# Patient Record
Sex: Male | Born: 1975 | Race: Black or African American | Hispanic: No | Marital: Married | Smoking: Former smoker
Health system: Southern US, Community
[De-identification: ages and names within clinical notes are randomized; demographics above are authoritative.]

## PROBLEM LIST (undated history)

## (undated) DIAGNOSIS — J45909 Unspecified asthma, uncomplicated: Secondary | ICD-10-CM

## (undated) DIAGNOSIS — I10 Essential (primary) hypertension: Secondary | ICD-10-CM

## (undated) DIAGNOSIS — R7303 Prediabetes: Secondary | ICD-10-CM

## (undated) DIAGNOSIS — M1990 Unspecified osteoarthritis, unspecified site: Secondary | ICD-10-CM

## (undated) HISTORY — PX: OTHER SURGICAL HISTORY: SHX169

## (undated) HISTORY — PX: HIP SURGERY: SHX245

---

## 1998-04-27 ENCOUNTER — Emergency Department (HOSPITAL_COMMUNITY): Admission: EM | Admit: 1998-04-27 | Discharge: 1998-04-27 | Payer: Self-pay | Admitting: Internal Medicine

## 1999-08-20 ENCOUNTER — Emergency Department (HOSPITAL_COMMUNITY): Admission: EM | Admit: 1999-08-20 | Discharge: 1999-08-20 | Payer: Self-pay | Admitting: Emergency Medicine

## 1999-08-20 ENCOUNTER — Encounter: Payer: Self-pay | Admitting: Emergency Medicine

## 1999-10-02 ENCOUNTER — Emergency Department (HOSPITAL_COMMUNITY): Admission: EM | Admit: 1999-10-02 | Discharge: 1999-10-02 | Payer: Self-pay | Admitting: Emergency Medicine

## 2001-06-23 ENCOUNTER — Emergency Department (HOSPITAL_COMMUNITY): Admission: EM | Admit: 2001-06-23 | Discharge: 2001-06-23 | Payer: Self-pay

## 2001-06-29 ENCOUNTER — Emergency Department (HOSPITAL_COMMUNITY): Admission: EM | Admit: 2001-06-29 | Discharge: 2001-06-29 | Payer: Self-pay | Admitting: Emergency Medicine

## 2001-06-29 ENCOUNTER — Encounter: Payer: Self-pay | Admitting: Emergency Medicine

## 2002-10-13 ENCOUNTER — Emergency Department (HOSPITAL_COMMUNITY): Admission: EM | Admit: 2002-10-13 | Discharge: 2002-10-13 | Payer: Self-pay | Admitting: Emergency Medicine

## 2002-10-13 ENCOUNTER — Encounter: Payer: Self-pay | Admitting: Emergency Medicine

## 2003-05-12 ENCOUNTER — Emergency Department (HOSPITAL_COMMUNITY): Admission: EM | Admit: 2003-05-12 | Discharge: 2003-05-12 | Payer: Self-pay | Admitting: Emergency Medicine

## 2003-08-23 ENCOUNTER — Emergency Department (HOSPITAL_COMMUNITY): Admission: AD | Admit: 2003-08-23 | Discharge: 2003-08-23 | Payer: Self-pay | Admitting: Family Medicine

## 2003-08-25 ENCOUNTER — Emergency Department (HOSPITAL_COMMUNITY): Admission: AD | Admit: 2003-08-25 | Discharge: 2003-08-25 | Payer: Self-pay | Admitting: Family Medicine

## 2003-09-01 ENCOUNTER — Emergency Department (HOSPITAL_COMMUNITY): Admission: AD | Admit: 2003-09-01 | Discharge: 2003-09-01 | Payer: Self-pay | Admitting: Family Medicine

## 2003-09-04 ENCOUNTER — Emergency Department (HOSPITAL_COMMUNITY): Admission: AD | Admit: 2003-09-04 | Discharge: 2003-09-04 | Payer: Self-pay | Admitting: Family Medicine

## 2003-12-28 ENCOUNTER — Emergency Department (HOSPITAL_COMMUNITY): Admission: EM | Admit: 2003-12-28 | Discharge: 2003-12-28 | Payer: Self-pay | Admitting: Family Medicine

## 2005-04-09 ENCOUNTER — Emergency Department (HOSPITAL_COMMUNITY): Admission: EM | Admit: 2005-04-09 | Discharge: 2005-04-09 | Payer: Self-pay | Admitting: Family Medicine

## 2005-11-18 ENCOUNTER — Emergency Department (HOSPITAL_COMMUNITY): Admission: EM | Admit: 2005-11-18 | Discharge: 2005-11-18 | Payer: Self-pay | Admitting: Family Medicine

## 2006-03-05 ENCOUNTER — Emergency Department (HOSPITAL_COMMUNITY): Admission: EM | Admit: 2006-03-05 | Discharge: 2006-03-05 | Payer: Self-pay | Admitting: Family Medicine

## 2006-11-11 ENCOUNTER — Emergency Department (HOSPITAL_COMMUNITY): Admission: EM | Admit: 2006-11-11 | Discharge: 2006-11-11 | Payer: Self-pay | Admitting: Family Medicine

## 2007-06-21 ENCOUNTER — Emergency Department (HOSPITAL_COMMUNITY): Admission: EM | Admit: 2007-06-21 | Discharge: 2007-06-21 | Payer: Self-pay | Admitting: Family Medicine

## 2007-11-09 ENCOUNTER — Emergency Department (HOSPITAL_COMMUNITY): Admission: EM | Admit: 2007-11-09 | Discharge: 2007-11-09 | Payer: Self-pay | Admitting: Emergency Medicine

## 2008-01-04 ENCOUNTER — Emergency Department (HOSPITAL_COMMUNITY): Admission: EM | Admit: 2008-01-04 | Discharge: 2008-01-04 | Payer: Self-pay | Admitting: Emergency Medicine

## 2008-11-17 ENCOUNTER — Emergency Department (HOSPITAL_COMMUNITY): Admission: EM | Admit: 2008-11-17 | Discharge: 2008-11-17 | Payer: Self-pay | Admitting: Emergency Medicine

## 2008-12-07 ENCOUNTER — Emergency Department (HOSPITAL_COMMUNITY): Admission: EM | Admit: 2008-12-07 | Discharge: 2008-12-07 | Payer: Self-pay | Admitting: Emergency Medicine

## 2009-10-11 IMAGING — CR DG ABDOMEN ACUTE W/ 1V CHEST
3 series · 3 of 3 positions shown · non-contrast
Comparison: Chest x-ray 11/17/2008

CLINICAL DATA: Abdominal pain.

ACUTE ABDOMEN SERIES (ABDOMEN 2 VIEW & CHEST 1 VIEW)

[w chest pa]
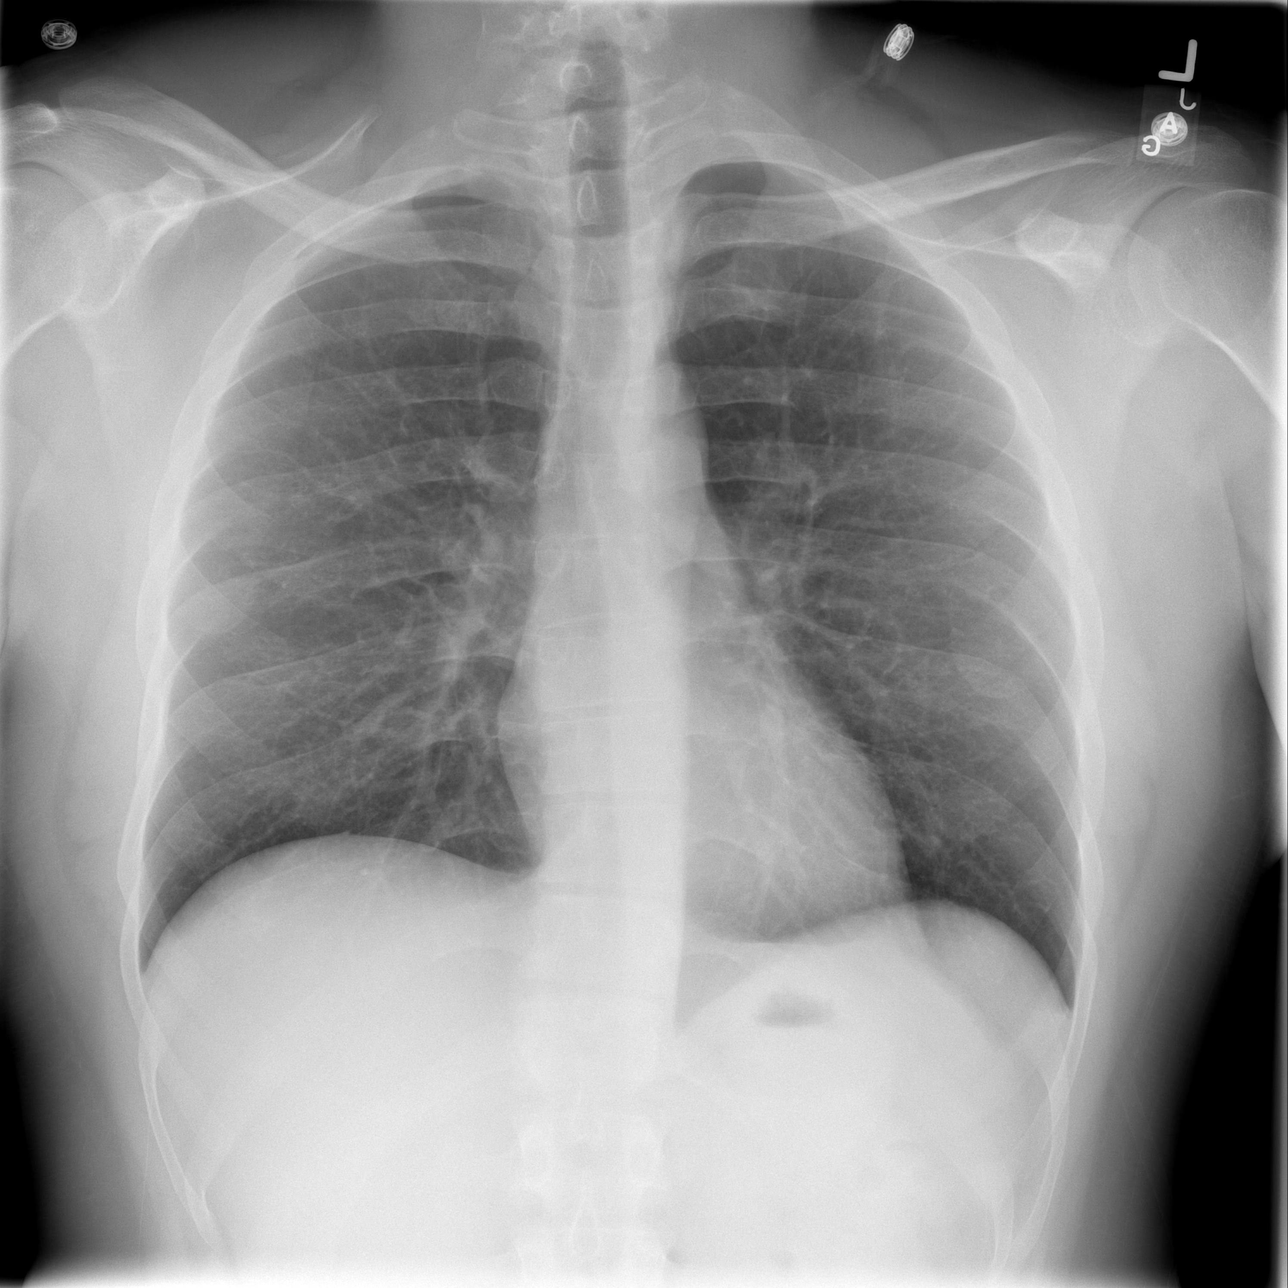

[w abdomen upright]
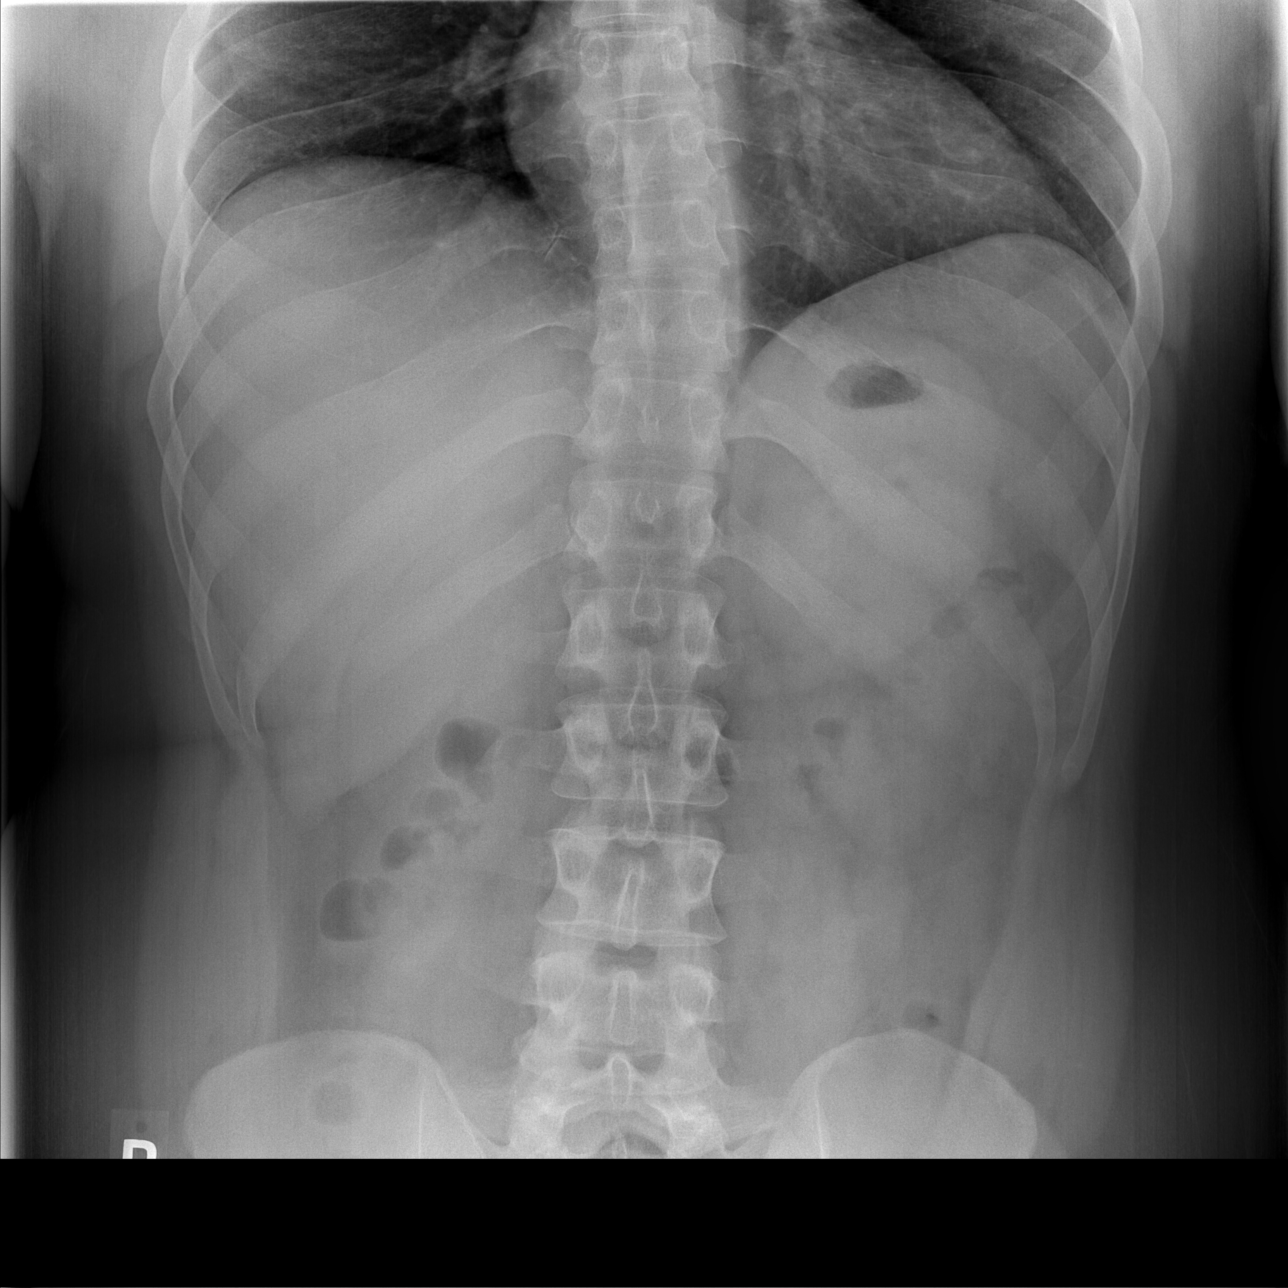

[t abdomen supine]
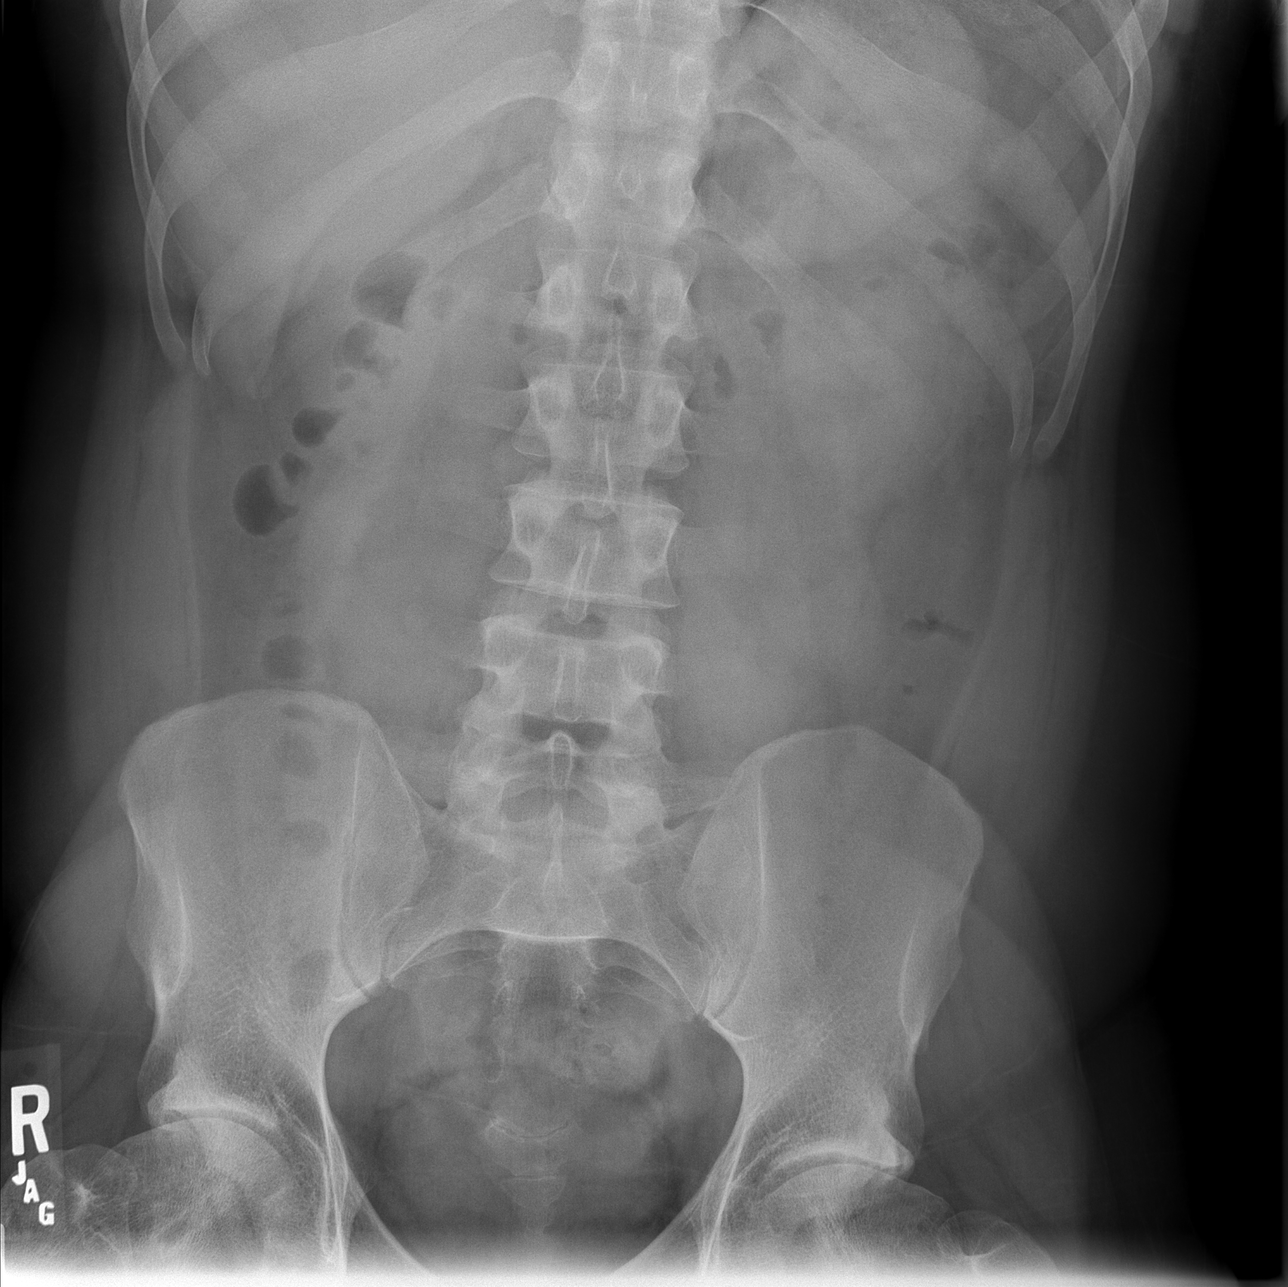

[3 of 3 positions shown; findings below may reference images not displayed]

FINDINGS: The bowel gas pattern is normal.  There is no evidence of
free intraperitoneal air.  No suspicious radio-opaque calculi or
other significant radiographic abnormality is seen. Heart size and
mediastinal contours are within normal limits.  Both lungs are
clear.
IMPRESSION: Unremarkable study.

## 2010-06-08 ENCOUNTER — Emergency Department (HOSPITAL_COMMUNITY): Admission: EM | Admit: 2010-06-08 | Discharge: 2010-06-09 | Payer: Self-pay | Admitting: Emergency Medicine

## 2010-12-22 LAB — COMPREHENSIVE METABOLIC PANEL
AST: 29 U/L (ref 0–37)
BUN: 6 mg/dL (ref 6–23)
CO2: 23 mEq/L (ref 19–32)
Calcium: 9.4 mg/dL (ref 8.4–10.5)
Creatinine, Ser: 0.97 mg/dL (ref 0.4–1.5)
GFR calc Af Amer: >60 mL/min (ref >60)
GFR calc non Af Amer: >60 mL/min (ref >60)

## 2010-12-22 LAB — DIFFERENTIAL
Basophils Absolute: 0 10*3/uL (ref 0.0–0.1)
Eosinophils Relative: 4 % (ref 0–5)
Lymphocytes Relative: 50 % — ABNORMAL HIGH (ref 12–46)
Lymphs Abs: 3.2 10*3/uL (ref 0.7–4.0)
Neutro Abs: 2.5 10*3/uL (ref 1.7–7.7)

## 2010-12-22 LAB — CBC
HCT: 37 % — ABNORMAL LOW (ref 39.0–52.0)
MCHC: 32.8 g/dL (ref 30.0–36.0)
MCV: 77.2 fL — ABNORMAL LOW (ref 78.0–100.0)
Platelets: 223 10*3/uL (ref 150–400)

## 2010-12-22 LAB — URINALYSIS, ROUTINE W REFLEX MICROSCOPIC
Bilirubin Urine: NEGATIVE
Glucose, UA: NEGATIVE mg/dL
Hgb urine dipstick: NEGATIVE
Protein, ur: NEGATIVE mg/dL
Urobilinogen, UA: 0.2 mg/dL (ref 0.0–1.0)

## 2010-12-22 LAB — LIPASE, BLOOD: Lipase: 15 U/L (ref 11–59)

## 2011-05-29 ENCOUNTER — Emergency Department (HOSPITAL_COMMUNITY)
Admission: EM | Admit: 2011-05-29 | Discharge: 2011-05-30 | Disposition: A | Discharge status: Home / Self Care | Payer: Self-pay | Attending: Emergency Medicine | Admitting: Emergency Medicine

## 2011-05-29 ENCOUNTER — Emergency Department (HOSPITAL_COMMUNITY)
Admission: EM | Admit: 2011-05-29 | Discharge: 2011-05-29 | Discharge status: Against Medical Advice | Payer: Self-pay | Attending: Emergency Medicine | Admitting: Emergency Medicine

## 2011-05-29 DIAGNOSIS — G8929 Other chronic pain: Secondary | ICD-10-CM | POA: Insufficient documentation

## 2011-05-29 DIAGNOSIS — M545 Low back pain, unspecified: Secondary | ICD-10-CM | POA: Insufficient documentation

## 2011-05-29 DIAGNOSIS — M25559 Pain in unspecified hip: Secondary | ICD-10-CM | POA: Insufficient documentation

## 2011-05-30 ENCOUNTER — Emergency Department (HOSPITAL_COMMUNITY): Payer: Self-pay

## 2012-03-23 ENCOUNTER — Emergency Department (INDEPENDENT_AMBULATORY_CARE_PROVIDER_SITE_OTHER)
Admission: EM | Admit: 2012-03-23 | Discharge: 2012-03-23 | Disposition: A | Discharge status: Home / Self Care | Payer: Self-pay | Source: Home / Self Care | Attending: Emergency Medicine | Admitting: Emergency Medicine

## 2012-03-23 ENCOUNTER — Encounter (HOSPITAL_COMMUNITY): Payer: Self-pay | Admitting: *Deleted

## 2012-03-23 DIAGNOSIS — K047 Periapical abscess without sinus: Secondary | ICD-10-CM

## 2012-03-23 MED ORDER — CLINDAMYCIN HCL 150 MG PO CAPS: 150.0000 mg | ORAL_CAPSULE | Freq: Three times a day (TID) | ORAL | Status: AC

## 2012-03-23 NOTE — ED Provider Notes (Signed)
Medical screening examination/treatment/procedure(s) were performed by non-physician practitioner and as supervising physician I was immediately available for consultation/collaboration.  [place]   Anadelia Kintz, MD 03/23/12 2022 

## 2012-03-23 NOTE — ED Notes (Signed)
Co toothache for past few weeks, has tried orajel with no relief.

## 2012-03-23 NOTE — ED Provider Notes (Signed)
History     CSN: [id number]  Arrival date & time 03/23/12  1205   First MD Initiated Contact with Patient 03/23/12 1234      Chief Complaint  Patient presents with  . Dental Pain    (Consider location/radiation/quality/duration/timing/severity/associated sxs/prior treatment) HPI Comments: Pt states that he has a history of dental problems, but not with the same tooth:pt states that he had 2 clindamycin left over from the last time and the swelling has actually gone down a little:pt denies fever  Patient is a 36 y.o. male presenting with tooth pain. The history is provided by the patient. No language interpreter was used.  Dental PainThe primary symptoms include mouth pain. The symptoms are unchanged. The symptoms occur constantly.  Additional symptoms include: facial swelling.    No past medical history on file.  No past surgical history on file.  No family history on file.  History  Substance Use Topics  . Smoking status: Not on file  . Smokeless tobacco: Not on file  . Alcohol Use: Not on file      Review of Systems  Constitutional: Negative.   HENT: Positive for facial swelling.   Respiratory: Negative.   Cardiovascular: Negative.     Allergies  Review of patient's allergies indicates not on file.  Home Medications  No current outpatient prescriptions on file.  BP 134/83  Pulse 66  Temp 98 F (36.7 C) (Oral)  Resp 18  SpO2 100%  Physical Exam  Nursing note and vitals reviewed. Constitutional: He appears well-developed and well-nourished.  HENT:  Right Ear: External ear normal.  Left Ear: External ear normal.       Mild swelling noted to the left upper cheek:pt has multiple decayed and cracked teeth to the left upper dentition  Cardiovascular: Normal rate and regular rhythm.   Pulmonary/Chest: Effort normal and breath sounds normal.    ED Course  Procedures (including critical care time)  Labs Reviewed - No data to display No results  found.   1. Dental abscess       MDM  Pt allergic to NWG:NFAO treat with clinda:discussed follow up with the dentist        Teressa Lower, NP 03/23/12 1402

## 2012-10-03 ENCOUNTER — Emergency Department (INDEPENDENT_AMBULATORY_CARE_PROVIDER_SITE_OTHER): Payer: Self-pay

## 2012-10-03 ENCOUNTER — Encounter (HOSPITAL_COMMUNITY): Payer: Self-pay | Admitting: *Deleted

## 2012-10-03 ENCOUNTER — Emergency Department (INDEPENDENT_AMBULATORY_CARE_PROVIDER_SITE_OTHER)
Admission: EM | Admit: 2012-10-03 | Discharge: 2012-10-03 | Disposition: A | Discharge status: Home / Self Care | Payer: Self-pay | Source: Home / Self Care | Attending: Emergency Medicine | Admitting: Emergency Medicine

## 2012-10-03 DIAGNOSIS — J189 Pneumonia, unspecified organism: Secondary | ICD-10-CM

## 2012-10-03 DIAGNOSIS — J45909 Unspecified asthma, uncomplicated: Secondary | ICD-10-CM

## 2012-10-03 HISTORY — DX: Unspecified asthma, uncomplicated: J45.909

## 2012-10-03 MED ORDER — AZITHROMYCIN 250 MG PO TABS: ORAL_TABLET | ORAL | Status: DC

## 2012-10-03 MED ORDER — ALBUTEROL SULFATE HFA 108 (90 BASE) MCG/ACT IN AERS: 1.0000 | INHALATION_SPRAY | Freq: Four times a day (QID) | RESPIRATORY_TRACT | Status: DC | PRN

## 2012-10-03 NOTE — ED Notes (Signed)
Pt reports cough and congestion since 1/17 with red streaks in sputum - son sick with similar symptoms - body  Aches, fever, headache, cough/congestion

## 2012-10-03 NOTE — ED Provider Notes (Addendum)
Chief Complaint  Patient presents with  . URI    History of Present Illness:   Justin Walls  is a 37 year old male with a history of asthma who has had a one-week history of cough productive of yellow to brown sputum with blood, aching in his chest when he coughs, trouble breathing or chest tightness and wheezing, chills, and he had some fever at the onset of symptoms. He's had nasal congestion, rhinorrhea with bloody drainage, headache, sinus pressure, ear popping, and sore throat. He's felt lightheaded, nauseated, not had much of an appetite. He has a 4 to five-year history of asthma which is mild and intermittent. He is not a smoker. He controls this with albuterol inhaler which she uses as needed, he states he's not had to use one in years.  Review of Systems:  Other than noted above, the patient denies any of the following symptoms. Systemic:  No fever, chills, sweats, fatigue, myalgias, headache, or anorexia. Eye:  No redness, pain or drainage. ENT:  No earache, ear congestion, nasal congestion, sneezing, rhinorrhea, sinus pressure, sinus pain, post nasal drip, or sore throat. Lungs:  No cough, sputum production, wheezing, shortness of breath, or chest pain. GI:  No abdominal pain, nausea, vomiting, or diarrhea.  PMFSH:  Past medical history, family history, social history, meds, and allergies were reviewed.  Physical Exam:   Vital signs:  BP 162/93  Pulse 68  Temp 98.7 F (37.1 C) (Oral)  Resp 18  SpO2 100% General:  Alert, in no distress. Eye:  No conjunctival injection or drainage. Lids were normal. ENT:  TMs and canals were normal, without erythema or inflammation.  Nasal mucosa was clear and uncongested, without drainage.  Mucous membranes were moist.  Pharynx was clear, without exudate or drainage.  There were no oral ulcerations or lesions. Neck:  Supple, no adenopathy, tenderness or mass. Lungs:  No respiratory distress.  Lungs were clear to auscultation, without wheezes,  rales or rhonchi.  Breath sounds were clear and equal bilaterally.  Heart:  Regular rhythm, without gallops, murmers or rubs. Skin:  Clear, warm, and dry, without rash or lesions.  Radiology:  Dg Chest 2 View  10/03/2012  *RADIOLOGY REPORT*  Clinical Data: Productive cough and hemoptysis  CHEST - 2 VIEW  Comparison: November 17, 2008  Findings:  Patchy infiltrate is noted in the anterior segment right upper lobe.  Lungs are otherwise clear.  Heart size and pulmonary vascularity are normal.  No adenopathy.  There is mild lower thoracic levoscoliosis.  IMPRESSION: Patchy infiltrate anterior segment right upper lobe. Would advise followup films to clearing given the hemoptysis. Elsewhere, lungs clear.   Original Report Authenticated By: Bretta Bang, M.D.    I reviewed the images independently and personally and concur with the radiologist's findings.  Assessment:  The primary encounter diagnosis was Community acquired pneumonia. A diagnosis of Asthma was also pertinent to this visit.  Plan:   1.  The following meds were prescribed:   New Prescriptions   ALBUTEROL (PROVENTIL HFA;VENTOLIN HFA) 108 (90 BASE) MCG/ACT INHALER    Inhale 1-2 puffs into the lungs every 6 (six) hours as needed for wheezing.   AZITHROMYCIN (ZITHROMAX Z-PAK) 250 MG TABLET    Take as directed.   2.  The patient was instructed in symptomatic care and handouts were given. 3.  The patient was told to return if becoming worse in any way, for a routine recheck in 48 hours, and given some red flag symptoms that would  indicate earlier return. I also suggested he return again in a month for a repeat chest x-ray.The patient was told that the differential diagnosis includes cancer, and that it was of the utmost importance to followup with the specialist to whom he was referred. I also discussed his blood pressure when and told he would need followup for that as well.    Reuben Likes, MD 10/03/12 1600  Reuben Likes, MD 10/03/12  [phone]

## 2012-10-05 ENCOUNTER — Encounter (HOSPITAL_COMMUNITY): Payer: Self-pay | Admitting: Emergency Medicine

## 2012-10-05 ENCOUNTER — Emergency Department (INDEPENDENT_AMBULATORY_CARE_PROVIDER_SITE_OTHER)
Admission: EM | Admit: 2012-10-05 | Discharge: 2012-10-05 | Disposition: A | Discharge status: Home / Self Care | Payer: Self-pay | Source: Home / Self Care | Attending: Family Medicine | Admitting: Family Medicine

## 2012-10-05 ENCOUNTER — Emergency Department (INDEPENDENT_AMBULATORY_CARE_PROVIDER_SITE_OTHER): Payer: Self-pay

## 2012-10-05 DIAGNOSIS — J189 Pneumonia, unspecified organism: Secondary | ICD-10-CM

## 2012-10-05 MED ORDER — HYDROCODONE-HOMATROPINE 5-1.5 MG/5ML PO SYRP: 5.0000 mL | ORAL_SOLUTION | ORAL | Status: DC | PRN

## 2012-10-05 NOTE — ED Provider Notes (Signed)
History     CSN: [id number]  Arrival date & time 10/05/12  1155   First MD Initiated Contact with Patient 10/05/12 1215      Chief Complaint  Patient presents with  . Follow-up    pt here for follow up of pneumonia.     (Consider location/radiation/quality/duration/timing/severity/associated sxs/prior treatment) Patient is a 37 y.o. male presenting with cough. The history is provided by the patient. No language interpreter was used.  Cough This is a new problem. The problem occurs constantly. The problem has been gradually worsening. The cough is non-productive. There has been no fever. Associated symptoms include myalgias and shortness of breath. The treatment provided no relief. He is not a smoker. His past medical history is significant for pneumonia.  Pt complains of worsening bodyaches.  Pt reports chest is hurting.  Pt is here for recheck of pneumonia/mass/htn  Past Medical History  Diagnosis Date  . Asthma     Past Surgical History  Procedure Date  . Hip sx   . Hip surgery     History reviewed. No pertinent family history.  History  Substance Use Topics  . Smoking status: Never Smoker   . Smokeless tobacco: Not on file  . Alcohol Use: Yes      Review of Systems  Respiratory: Positive for cough and shortness of breath.   Musculoskeletal: Positive for myalgias.  All other systems reviewed and are negative.    Allergies  Amoxicillin  Home Medications   Current Outpatient Rx  Name  Route  Sig  Dispense  Refill  . ALBUTEROL SULFATE HFA 108 (90 BASE) MCG/ACT IN AERS   Inhalation   Inhale 1-2 puffs into the lungs every 6 (six) hours as needed for wheezing.   1 Inhaler   12   . AZITHROMYCIN 250 MG PO TABS      Take as directed.   6 tablet   0     BP 132/95  Pulse 72  Temp 98.1 F (36.7 C) (Oral)  Resp 18  SpO2 100%  Physical Exam  Nursing note and vitals reviewed. Constitutional: He is oriented to person, place, and time. He appears  well-developed and well-nourished.  HENT:  Head: Normocephalic and atraumatic.  Eyes: Pupils are equal, round, and reactive to light.  Neck: Normal range of motion. Neck supple.  Cardiovascular: Normal rate.   Pulmonary/Chest: Effort normal.  Abdominal: Soft.  Musculoskeletal: Normal range of motion.  Neurological: He is alert and oriented to person, place, and time.  Skin: Skin is warm.  Psychiatric: He has a normal mood and affect.    ED Course  Procedures (including critical care time)  Labs Reviewed - No data to display No results found.   No diagnosis found.    MDM  I rexrayed pt to make sure no expanding pneumonia.   Xray no change.  Pt advised must have 5 week repeat xray.   Pt advised followup as instructed by Dr. Lorenz Coaster.   Pt given hycodan to help with pain and cough       Lonia Skinner [place], [place] 10/05/12 1431

## 2012-10-05 NOTE — ED Notes (Signed)
Pt here for follow up of pneumonia. Pt was seen on 10/04/11. Pt states that he feels worse. Body hurts all over. And having pains in chest. Pt just started meds on 1/24, due to financial reasons.   Pt denies any other symptoms.

## 2012-10-07 NOTE — ED Provider Notes (Signed)
Medical screening examination/treatment/procedure(s) were performed by resident physician or non-physician practitioner and as supervising physician I was immediately available for consultation/collaboration.   KINDL,JAMES DOUGLAS MD.    James D Kindl, MD 10/07/12 1558 

## 2014-10-29 ENCOUNTER — Emergency Department (INDEPENDENT_AMBULATORY_CARE_PROVIDER_SITE_OTHER)
Admission: EM | Admit: 2014-10-29 | Discharge: 2014-10-29 | Disposition: A | Discharge status: Home / Self Care | Payer: PRIVATE HEALTH INSURANCE | Source: Home / Self Care | Attending: Family Medicine | Admitting: Family Medicine

## 2014-10-29 ENCOUNTER — Encounter (HOSPITAL_COMMUNITY): Payer: Self-pay | Admitting: Emergency Medicine

## 2014-10-29 ENCOUNTER — Ambulatory Visit: Payer: Self-pay | Attending: Internal Medicine

## 2014-10-29 DIAGNOSIS — K029 Dental caries, unspecified: Secondary | ICD-10-CM

## 2014-10-29 MED ORDER — LIDOCAINE VISCOUS 2 % MT SOLN: OROMUCOSAL | Status: DC

## 2014-10-29 MED ORDER — NAPROXEN 500 MG PO TABS: 500.0000 mg | ORAL_TABLET | Freq: Two times a day (BID) | ORAL | Status: DC

## 2014-10-29 NOTE — ED Provider Notes (Signed)
CSN: [id number]     Arrival date & time 10/29/14  1045 History   First MD Initiated Contact with Patient 10/29/14 1143     Chief Complaint  Patient presents with  . Dental Pain   (Consider location/radiation/quality/duration/timing/severity/associated sxs/prior Treatment) Patient is a 39 y.o. male presenting with tooth pain. The history is provided by the patient.  Dental Pain Location:  Lower Lower teeth location:  18/LL 2nd molar Quality:  Aching and constant Severity:  Moderate Onset quality:  Gradual Duration:  3 weeks Timing:  Constant Progression:  Worsening Chronicity:  Recurrent Context: dental caries   Ineffective treatments:  NSAIDs Risk factors: lack of dental care and smoking     Past Medical History  Diagnosis Date  . Asthma    Past Surgical History  Procedure Laterality Date  . Hip sx    . Hip surgery     History reviewed. No pertinent family history. History  Substance Use Topics  . Smoking status: Never Smoker   . Smokeless tobacco: Not on file  . Alcohol Use: Yes    Review of Systems  All other systems reviewed and are negative.   Allergies  Amoxicillin  Home Medications   Prior to Admission medications   Medication Sig Start Date End Date Taking? Authorizing Provider  albuterol (PROVENTIL HFA;VENTOLIN HFA) 108 (90 BASE) MCG/ACT inhaler Inhale 1-2 puffs into the lungs every 6 (six) hours as needed for wheezing. 10/03/12   Reuben Likes, MD  azithromycin (ZITHROMAX Z-PAK) 250 MG tablet Take as directed. 10/03/12   Reuben Likes, MD  HYDROcodone-homatropine [facility]) 5-1.5 MG/5ML syrup Take 5 mLs by mouth every 4 (four) hours as needed for cough. 10/05/12   Elson Areas, PA-C  lidocaine (XYLOCAINE) 2 % solution Apply to affected area using cotton swab every 3 hours as needed for pain 10/29/14   Ria Clock, PA  naproxen (NAPROSYN) 500 MG tablet Take 1 tablet (500 mg total) by mouth 2 (two) times daily with a meal. As needed for pain  10/29/14   Jess Barters H Gizell Danser, PA   BP 138/79 mmHg  Pulse 74  Temp(Src) 98.6 F (37 C) (Oral)  Resp 18  SpO2 98% Physical Exam  Constitutional: He is oriented to person, place, and time. He appears well-developed and well-nourished. No distress.  HENT:  Head: Normocephalic and atraumatic.  Right Ear: Hearing and external ear normal. No mastoid tenderness.  Left Ear: Hearing and external ear normal. No mastoid tenderness.  Nose: Nose normal.  Mouth/Throat: Uvula is midline, oropharynx is clear and moist and mucous membranes are normal. No oral lesions. No trismus in the jaw. Dental caries present. No dental abscesses or uvula swelling.    Neck: Normal range of motion. Neck supple.  Cardiovascular: Normal rate.   Pulmonary/Chest: Effort normal.  Lymphadenopathy:    He has no cervical adenopathy.  Neurological: He is alert and oriented to person, place, and time.  Skin: Skin is warm and dry.  Psychiatric: He has a normal mood and affect. His behavior is normal.  Nursing note and vitals reviewed.   ED Course  Procedures (including critical care time) Labs Review Labs Reviewed - No data to display  Imaging Review No results found.   MDM   1. Dental cavity   Naprosyn and viscous lidocaine as directed. Patient provided with printed list of low cost dental option in area as he has had some difficulty accessing the dental clinic associated with [facility] orange card discount. No  clinical evidence of dental abscess.    Ria ClockJennifer Lee H Mykira Hofmeister, [place] 10/29/14 [phone]

## 2014-10-29 NOTE — ED Notes (Signed)
Pt states that he has had dental pain from what he believes to be a tooth abcess for 3 weeks.

## 2014-10-29 NOTE — Discharge Instructions (Signed)

## 2015-01-31 ENCOUNTER — Emergency Department (HOSPITAL_COMMUNITY)
Admission: EM | Admit: 2015-01-31 | Discharge: 2015-01-31 | Disposition: A | Discharge status: Home / Self Care | Payer: PRIVATE HEALTH INSURANCE | Attending: Emergency Medicine | Admitting: Emergency Medicine

## 2015-01-31 ENCOUNTER — Encounter (HOSPITAL_COMMUNITY): Payer: Self-pay | Admitting: *Deleted

## 2015-01-31 ENCOUNTER — Emergency Department (HOSPITAL_COMMUNITY): Payer: PRIVATE HEALTH INSURANCE

## 2015-01-31 DIAGNOSIS — G8929 Other chronic pain: Secondary | ICD-10-CM | POA: Insufficient documentation

## 2015-01-31 DIAGNOSIS — M25559 Pain in unspecified hip: Secondary | ICD-10-CM

## 2015-01-31 DIAGNOSIS — Z880 Allergy status to penicillin: Secondary | ICD-10-CM | POA: Insufficient documentation

## 2015-01-31 DIAGNOSIS — Z79899 Other long term (current) drug therapy: Secondary | ICD-10-CM | POA: Insufficient documentation

## 2015-01-31 DIAGNOSIS — Z791 Long term (current) use of non-steroidal anti-inflammatories (NSAID): Secondary | ICD-10-CM | POA: Insufficient documentation

## 2015-01-31 DIAGNOSIS — J45909 Unspecified asthma, uncomplicated: Secondary | ICD-10-CM | POA: Insufficient documentation

## 2015-01-31 DIAGNOSIS — I10 Essential (primary) hypertension: Secondary | ICD-10-CM | POA: Insufficient documentation

## 2015-01-31 DIAGNOSIS — M25552 Pain in left hip: Secondary | ICD-10-CM | POA: Insufficient documentation

## 2015-01-31 DIAGNOSIS — Z9889 Other specified postprocedural states: Secondary | ICD-10-CM | POA: Insufficient documentation

## 2015-01-31 DIAGNOSIS — M25551 Pain in right hip: Secondary | ICD-10-CM

## 2015-01-31 HISTORY — DX: Essential (primary) hypertension: I10

## 2015-01-31 MED ORDER — DOCUSATE SODIUM 100 MG PO CAPS: 100.0000 mg | ORAL_CAPSULE | Freq: Every day | ORAL | Status: DC | PRN

## 2015-01-31 MED ORDER — OXYCODONE-ACETAMINOPHEN 5-325 MG PO TABS: 1.0000 | ORAL_TABLET | Freq: Four times a day (QID) | ORAL | Status: DC | PRN

## 2015-01-31 MED ORDER — OXYCODONE-ACETAMINOPHEN 5-325 MG PO TABS
1.0000 | ORAL_TABLET | Freq: Once | ORAL | Status: AC
  Administered 2015-01-31: 1 via ORAL
  Filled 2015-01-31: qty 1

## 2015-01-31 MED ORDER — ONDANSETRON HCL 4 MG/2ML IJ SOLN
4.0000 mg | Freq: Once | INTRAMUSCULAR | Status: AC
  Administered 2015-01-31: 4 mg via INTRAVENOUS
  Filled 2015-01-31: qty 2

## 2015-01-31 MED ORDER — FENTANYL CITRATE (PF) 100 MCG/2ML IJ SOLN
50.0000 ug | Freq: Once | INTRAMUSCULAR | Status: AC
  Administered 2015-01-31: 50 ug via INTRAVENOUS
  Filled 2015-01-31: qty 2

## 2015-01-31 MED ORDER — HYDROMORPHONE HCL 1 MG/ML IJ SOLN
1.0000 mg | Freq: Once | INTRAMUSCULAR | Status: AC
  Administered 2015-01-31: 1 mg via INTRAVENOUS
  Filled 2015-01-31: qty 1

## 2015-01-31 MED ORDER — KETOROLAC TROMETHAMINE 30 MG/ML IJ SOLN
30.0000 mg | Freq: Once | INTRAMUSCULAR | Status: AC
  Administered 2015-01-31: 30 mg via INTRAVENOUS
  Filled 2015-01-31: qty 1

## 2015-01-31 NOTE — ED Provider Notes (Signed)
CSN: [id number]     Arrival date & time 01/31/15  1205 History   First MD Initiated Contact with Patient 01/31/15 1327     Chief Complaint  Patient presents with  . Hip Pain     (Consider location/radiation/quality/duration/timing/severity/associated sxs/prior Treatment) Patient is a 39 y.o. male presenting with hip pain. The history is provided by the patient.  Hip Pain This is a chronic problem. The current episode started more than 1 week ago. The problem occurs constantly. The problem has been gradually worsening. Pertinent negatives include no chest pain, no abdominal pain, no headaches and no shortness of breath. The symptoms are aggravated by walking. Nothing relieves the symptoms. Treatments tried: ibuprofen. The treatment provided mild relief.    Past Medical History  Diagnosis Date  . Asthma   . Hypertension    Past Surgical History  Procedure Laterality Date  . Hip sx    . Hip surgery     No family history on file. History  Substance Use Topics  . Smoking status: Never Smoker   . Smokeless tobacco: Not on file  . Alcohol Use: Yes    Review of Systems  Constitutional: Negative for fever.  HENT: Negative for drooling and rhinorrhea.   Eyes: Negative for pain.  Respiratory: Negative for cough and shortness of breath.   Cardiovascular: Negative for chest pain and leg swelling.  Gastrointestinal: Negative for nausea, vomiting, abdominal pain and diarrhea.  Genitourinary: Negative for dysuria and hematuria.  Musculoskeletal: Negative for gait problem and neck pain.  Skin: Negative for color change.  Neurological: Negative for numbness and headaches.  Hematological: Negative for adenopathy.  Psychiatric/Behavioral: Negative for behavioral problems.  All other systems reviewed and are negative.     Allergies  Amoxicillin  Home Medications   Prior to Admission medications   Medication Sig Start Date End Date Taking? Authorizing Provider  albuterol  (PROVENTIL HFA;VENTOLIN HFA) 108 (90 BASE) MCG/ACT inhaler Inhale 1-2 puffs into the lungs every 6 (six) hours as needed for wheezing. 10/03/12  Yes Reuben Likes, MD  azithromycin (ZITHROMAX Z-PAK) 250 MG tablet Take as directed. Patient not taking: Reported on 01/31/2015 10/03/12   Reuben Likes, MD  HYDROcodone-homatropine [facility]) 5-1.5 MG/5ML syrup Take 5 mLs by mouth every 4 (four) hours as needed for cough. Patient not taking: Reported on 01/31/2015 10/05/12   Elson Areas, PA-C  lidocaine (XYLOCAINE) 2 % solution Apply to affected area using cotton swab every 3 hours as needed for pain Patient not taking: Reported on 01/31/2015 10/29/14   Ria Clock, PA  naproxen (NAPROSYN) 500 MG tablet Take 1 tablet (500 mg total) by mouth 2 (two) times daily with a meal. As needed for pain Patient not taking: Reported on 01/31/2015 10/29/14   Jess Barters H Presson, PA   BP 157/71 mmHg  Pulse 68  Temp(Src) 97.7 F (36.5 C) (Oral)  Resp 18  SpO2 100% Physical Exam  Constitutional: He is oriented to person, place, and time. He appears well-developed and well-nourished.  HENT:  Head: Normocephalic and atraumatic.  Right Ear: External ear normal.  Left Ear: External ear normal.  Nose: Nose normal.  Mouth/Throat: Oropharynx is clear and moist. No oropharyngeal exudate.  Eyes: Conjunctivae and EOM are normal. Pupils are equal, round, and reactive to light.  Neck: Normal range of motion. Neck supple.  Cardiovascular: Normal rate, regular rhythm, normal heart sounds and intact distal pulses.  Exam reveals no gallop and no friction rub.   No  murmur heard. Pulmonary/Chest: Effort normal and breath sounds normal. No respiratory distress. He has no wheezes.  Abdominal: Soft. Bowel sounds are normal. He exhibits no distension. There is no tenderness. There is no rebound and no guarding.  Musculoskeletal: Normal range of motion. He exhibits tenderness. He exhibits no edema.  Normal range of motion  of the left hip and knee. Mild pain with ranging the left hip.  Significantly limited range of motion of the right hip due to pain.  2+ pulses and distal bilateral lower extremities.  Neurological: He is alert and oriented to person, place, and time.  Skin: Skin is warm and dry.  Psychiatric: He has a normal mood and affect. His behavior is normal.  Nursing note and vitals reviewed.   ED Course  Procedures (including critical care time) Labs Review Labs Reviewed - No data to display  Imaging Review Dg Hip Unilat With Pelvis 2-3 Views Right  01/31/2015   CLINICAL DATA:  Right hip pain today. No known injury. Chronic bilateral hip pain. Previous bilateral hip surgery  EXAM: RIGHT HIP (WITH PELVIS) 2-3 VIEWS  COMPARISON:  None.  FINDINGS: There is symmetrical deformity of the femoral heads. There is also marked superior left hip joint space narrowing and moderate superior right hip joint space narrowing. Mild bilateral inferior spur formation is also noted. There are screw tracts in both femoral necks.  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   Electronically Signed   By: Beckie Salts M.D.   On: 01/31/2015 13:28     EKG Interpretation None      MDM   Final diagnoses:  Hip pain    2:03 PM 39 y.o. 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 He notes acute worsening of the right hip pain over the last 2-3 days. He denies any injury. He denies any fevers. He is afebrile and vital signs are unremarkable here. We'll get pain control and screening imaging. The patient states that he takes ibuprofen intermittently for the pain.  4:23 PM: Pain improved. Imaging non-contrib. Will provide crutches and Rx for pain medicine. Pt will need pcp and ultimately  repeat ortho eval in the furture. I have discussed the diagnosis/risks/treatment options with the patient and believe the pt to be eligible for discharge home to follow-up with a pcp. We also discussed returning to the ED immediately if new or worsening sx occur. We discussed the sx which are most concerning (e.g., worsening pain, fever) that necessitate immediate return. Medications administered to the patient during their visit and any new prescriptions provided to the patient are listed below.  Medications given during this visit Medications  fentaNYL (SUBLIMAZE) injection 50 mcg (50 mcg Intravenous Given 01/31/15 1224)  ondansetron (ZOFRAN) injection 4 mg (4 mg Intravenous Given 01/31/15 1224)  HYDROmorphone (DILAUDID) injection 1 mg (1 mg Intravenous Given 01/31/15 1409)  ketorolac (TORADOL) 30 MG/ML injection 30 mg (30 mg Intravenous Given 01/31/15 1410)  oxyCODONE-acetaminophen (PERCOCET/ROXICET) 5-325 MG per tablet 1 tablet (1 tablet Oral Given 01/31/15 1554)    New Prescriptions   DOCUSATE SODIUM (COLACE) 100 MG CAPSULE    Take 1 capsule (100 mg total) by mouth daily as needed for mild constipation.   OXYCODONE-ACETAMINOPHEN (PERCOCET) 5-325 MG PER TABLET    Take 1-2 tablets by mouth every 6 (six) hours as needed  for moderate pain.     Purvis SheffieldForrest Hailyn Zarr, MD 02/01/15 1750

## 2015-01-31 NOTE — ED Notes (Addendum)
Pt reports Hx bil hip surgery placing pins in hips 2/2 multiple dislocations. No problems since then except arthritis pain. Today was about to get in the shower and lifted R leg to step in and felt immediate sharp pain down R leg, and had to lower self to the floor to avoid falling. No shortening or rotation noted.

## 2015-01-31 NOTE — Discharge Instructions (Signed)
°Emergency Department Resource Guide °1) Find a Doctor and Pay Out of Pocket °Although you won't have to find out who is covered by your insurance plan, it is a good idea to ask around and get recommendations. You will then need to call the office and see if the doctor you have chosen will accept you as a new patient and what types of options they offer for patients who are self-pay. Some doctors offer discounts or will set up payment plans for their patients who do not have insurance, but you will need to ask so you aren't surprised when you get to your appointment. ° °2) Contact Your Local Health Department °Not all health departments have doctors that can see patients for sick visits, but many do, so it is worth a call to see if yours does. If you don't know where your local health department is, you can check in your phone book. The CDC also has a tool to help you locate your state's health department, and many state websites also have listings of all of their local health departments. ° °3) Find a Walk-in Clinic °If your illness is not likely to be very severe or complicated, you may want to try a walk in clinic. These are popping up all over the country in pharmacies, drugstores, and shopping centers. They're usually staffed by nurse practitioners or physician assistants that have been trained to treat common illnesses and complaints. They're usually fairly quick and inexpensive. However, if you have serious medical issues or chronic medical problems, these are probably not your best option. ° °No Primary Care Doctor: °- Call [facility] at  832-8000 - they can help you locate a primary care doctor that  accepts your insurance, provides certain services, etc. °- Physician Referral Service- 1-800-533-3463 ° °Chronic Pain Problems: °Organization         Address  Phone   Notes  °[facility]  (336) 297-2271 Patients need to be referred by their primary care doctor.  ° °Medication  Assistance: °Organization         Address  Phone   Notes  °[place] [facility] [address] °[place], [place] [address] (336) 641-8030 --Must be a resident of [place] °-- Must have NO insurance coverage whatsoever (no Medicaid/ Medicare, etc.) °-- The pt. MUST have a primary care doctor that directs their care regularly and follows them in the community °  °[facility]  (866) 331-1348   °[employer]  (888) 892-1162   ° °Agencies that provide inexpensive medical care: °Organization         Address  Phone   Notes  °[facility]  (336) 832-8035   °[facility]    (336) 832-7272   °[facility] [address] °[place], [place] [address] (336) 832-4777   °[facility] [address], °[place] (336) 271-4999   °[facility]    (336) 373-0678   °[facility]    (336) 272-1050   °[facility] ° [address], [place] Phone:  (336) 832-4444, Fax:  (336) 832-4440 Hours of Operation:  9 am - 6 pm, M-F.  Also accepts Medicaid/Medicare and self-pay.  °[facility] ° [address], [place] Phone: (336) 832-3150, Fax: (336) 832-3151. Hours of Operation:  8:30 am - 5:30 pm, M-F.  Also accepts Medicaid and self-pay.  °[facility] [place] [address], [place] Phone: (336) 878-6027   °[facility] [address], [place], [place] (336)723-1848, Ext. 123 Mondays & Thursdays: 7-9 AM.  First 15 patients are seen on a first come, first serve basis. °  ° °Medicaid-accepting [place] Providers: ° °Organization         Address  Phone   Notes  °[facility] [address], [place] (336) 641-2100 Also accepts self-pay patients.  °[facility] [address], [place] ° (336) 856-9996   °[facility] [address], [place]  (336) 288-8857   °[facility] [address] [place] [address], [place] (336) 299-7000   °Veita Bland [address], [place]  ° (336) 373-1557 Only accepts [place] Access Medicaid patients after they have their name applied to their card.  ° °Self-Pay (no insurance) in [place]: ° °Organization         Address  Phone   Notes  °Sickle Cell Patients, [facility] [address], [place] (336) 832-1970   °[facility] [address], [place] (336) 832-4400   °[facility] ° [address], [place] (336) 992-4800   °[facility]/Dr. Osei-Bonsu ° [address] [place] [address], [place] or [address], [place] (336) 841-8500 Phone number for both [place] and [place] locations is the same.  °[facility] [address], [place] (336) 299-0000   °[facility] [address] [place] [address], [place] or [address] (336) 852-7530 °(336) 878-2260   °[facility] [address], [place] (336) 350-1642, phone; (336) 294-5005, fax Sees patients 1st and 3rd Saturday of every month.  Must not qualify for public or private insurance (i.e. Medicaid, Medicare, [place] Health Choice, Veterans' Benefits) • Household income should be no more than 200% of the poverty level •The clinic cannot treat you if you are pregnant or think you are pregnant • Sexually transmitted diseases are not treated at the clinic.  ° ° °Dental Care: °Organization         Address  Phone  Notes  °[place] [facility] [address],  (336) 641-6152 Accepts children up to age 21 who are enrolled in Medicaid or [place] Health Choice; pregnant women with a Medicaid card; and children who have applied for Medicaid or [place] Health Choice, but were declined, whose parents can pay a reduced fee at time of service.  °[place]  [facility] [place]  [address], [place] (336) 641-7733 Accepts children up to age 21 who are enrolled in Medicaid or [place] Health Choice; pregnant women with a Medicaid card; and children who have applied for Medicaid or  Health Choice, but were declined, whose parents can pay a reduced fee at time of service.  °[facility] ° [address],  (336) 641-4533 Patients are seen by appointment only. Walk-ins are not accepted. [facility] will see patients 18 years of age and older. °Monday - Tuesday (8am-5pm) °Most Wednesdays (8:30-5pm) °$30 per visit, cash only  °[facility] ° [address], [place] (336) 641-4533 Patients are seen by appointment only. Walk-ins are not accepted. [facility] will see patients 18 years of age and older. °One   Wednesday Evening (Monthly: Volunteer Based).  $30 per visit, cash only  °[facility]  (919) 537-3737 for adults; Children under age 4, call Graduate Pediatric Dentistry at (919) [phone]. Children aged 4-14, please call (919) [phone] to request a pediatric application. ° Dental services are provided in all areas of dental care including fillings, crowns and bridges, complete and partial dentures, implants, gum treatment, root canals, and extractions. Preventive care is also provided. Treatment is provided to both adults and children. °Patients are selected via a lottery and there is often a waiting list. °  °[facility] [address], °[place] ° (336) 763-8833 [email or web address] °  °[facility] [address], [place], [place] (336)723-1848, Ext. 123 Second and Fourth Thursday of each month, opens at 6:30 AM; Clinic ends at 9 AM.  Patients are seen on a first-come first-served basis, and a limited number are seen during each clinic.  ° °[facility] ° [address], [place], [place] (336) 723-7904    Eligibility Requirements °You must have lived in [place], or [place] counties for at least the last three months. °  You cannot be eligible for state or federal sponsored healthcare insurance, including Veterans Administration, Medicaid, or Medicare. °  You generally cannot be eligible for healthcare insurance through your employer.  °  How to apply: °Eligibility screenings are held every Tuesday and Wednesday afternoon from 1:00 pm until 4:00 pm. You do not need an appointment for the interview!  °[facility] [address], [place], [place] 336-631-2330   °[facility]  336-342-8273   °[facility]  336-703-3100   °[facility]  336-570-6415   ° °Behavioral Health Resources in the Community: °Intensive Outpatient Programs °Organization         Address  Phone  Notes  °[facility] [address], [place], [place] 336-878-6098   °[facility] [address], [place], [place] 336-832-9800   °[facility] [address], [place], [place] ° 336-882-2125   °[facility] [address],  °[place], [place] 1-800-853-5163 or 336-641-4981   °Substance Abuse Resources °Organization         Address  Phone  Notes  °[facility]  336-882-2125   °[facility]  336-784-9470   °[facility]  336-285-9073   °[facility]  336-845-3988   °Residential & Outpatient Substance Abuse Program  1-800-659-3381   °Psychological Services °Organization         Address  Phone  Notes  °[facility]  336- 832-9600   °[facility]  336- 378-7881   °[facility] [address], [place] 1-800-853-5163 or 336-641-4981   ° °Mobile Crisis Teams °Organization         Address  Phone  Notes  °[facility], Mobile Crisis Care Unit  1-877-626-1772   °Assertive °[facility] ° [address]  [place], [place] 336-834-9664   °Sharon DeEsch [address] °[place] 336-554-5454   ° °Self-Help/Support Groups °Organization         Address  Phone             Notes  °[facility] [place] - variety of support groups  336- 373-1402 Call for more information  °Narcotics Anonymous (NA), [facility] [address], °[place]  2 meetings at this location  ° °  Residential Treatment Programs °Organization         Address  Phone  Notes  °[facility] [address],    °[place]  1-866-801-8205   °[facility] ° [address], [place], [place] 704-293-8524   °[facility] [address], [place] 336-845-3988 Admissions: 8am-3pm M-F  °[facility] [address]    °[place], [place] 336-841-1104   °[facility] [address], [place], [place] 336-379-7146   °[facility] [address]  °[place], [place] 336-285-9073   °[facility] - Intensive Outpatient [address], [place], [place] 336-852-3033   °[facility] [address]  °[place], [place] 1-877-615-2722 or 336-784-9470   °[facility] [address] [place], [place] 336-227-7417 Accepts Medicaid  °[facility] [address]  ° [place] 1-800-659-3381 Substance Abuse/Addiction Treatment  ° °[place] Behavioral Health Resources °Organization         Address  Phone  Notes  °[facility]  (888) 581-9988   °Julie Brannon, PhD [address] [place], [place]   (336) 349-5553 or (336) 951-0000   °[facility]   [address] °[place], [place] (336) 349-4454   °[facility] [address], [place], [place] (336) 342-8316 Insurance/Medicaid/sponsorship through [facility]  °[facility] [address]                                    [place], [place] (336) 342-8316 Therapy/tele-psych/case    °[facility] [address]  ° [place], [place] (336) 349-2233    °Dr. Arfeen  (336) 349-4544   °[facility] [place]  [employer] [place] [facility] 1) [address], [place] °2) [address], [place] °3)  [address], [place] (336) 349-3220 °(336) 342-7768 ° °(336) 342-8140   °[place] [facility] (336) 342-1394 or (336) 342-3537 (After Hours)    ° ° °

## 2016-04-11 ENCOUNTER — Emergency Department (HOSPITAL_COMMUNITY)
Admission: EM | Admit: 2016-04-11 | Discharge: 2016-04-11 | Disposition: A | Discharge status: Home / Self Care | Payer: Self-pay | Attending: Emergency Medicine | Admitting: Emergency Medicine

## 2016-04-11 ENCOUNTER — Ambulatory Visit (HOSPITAL_COMMUNITY)
Admission: EM | Admit: 2016-04-11 | Discharge: 2016-04-11 | Disposition: A | Discharge status: Nursing Home (Includes ICF) | Payer: Self-pay | Attending: Family Medicine | Admitting: Family Medicine

## 2016-04-11 ENCOUNTER — Encounter (HOSPITAL_COMMUNITY): Payer: Self-pay | Admitting: Emergency Medicine

## 2016-04-11 ENCOUNTER — Emergency Department (HOSPITAL_COMMUNITY): Payer: Self-pay

## 2016-04-11 DIAGNOSIS — J45909 Unspecified asthma, uncomplicated: Secondary | ICD-10-CM | POA: Insufficient documentation

## 2016-04-11 DIAGNOSIS — I10 Essential (primary) hypertension: Secondary | ICD-10-CM | POA: Insufficient documentation

## 2016-04-11 DIAGNOSIS — R51 Headache: Secondary | ICD-10-CM

## 2016-04-11 DIAGNOSIS — R519 Headache, unspecified: Secondary | ICD-10-CM

## 2016-04-11 MED ORDER — DEXAMETHASONE SODIUM PHOSPHATE 10 MG/ML IJ SOLN
10.0000 mg | Freq: Once | INTRAMUSCULAR | Status: AC
  Administered 2016-04-11: 10 mg via INTRAVENOUS
  Filled 2016-04-11: qty 1

## 2016-04-11 MED ORDER — SODIUM CHLORIDE 0.9 % IV BOLUS (SEPSIS)
1000.0000 mL | Freq: Once | INTRAVENOUS | Status: AC
  Administered 2016-04-11: 1000 mL via INTRAVENOUS

## 2016-04-11 MED ORDER — KETOROLAC TROMETHAMINE 30 MG/ML IJ SOLN
30.0000 mg | Freq: Once | INTRAMUSCULAR | Status: AC
  Administered 2016-04-11: 30 mg via INTRAVENOUS
  Filled 2016-04-11: qty 1

## 2016-04-11 MED ORDER — METOCLOPRAMIDE HCL 5 MG/ML IJ SOLN
10.0000 mg | Freq: Once | INTRAMUSCULAR | Status: AC
  Administered 2016-04-11: 10 mg via INTRAVENOUS
  Filled 2016-04-11: qty 2

## 2016-04-11 MED ORDER — DIPHENHYDRAMINE HCL 50 MG/ML IJ SOLN
25.0000 mg | Freq: Once | INTRAMUSCULAR | Status: AC
  Administered 2016-04-11: 25 mg via INTRAVENOUS
  Filled 2016-04-11: qty 1

## 2016-04-11 NOTE — ED Notes (Signed)
Family member coming to get 2 young children from patient prior to discharge/transfer

## 2016-04-11 NOTE — ED Notes (Signed)
Pt up to restroom.  Gait steady and even.   

## 2016-04-11 NOTE — ED Notes (Signed)
Patient able to ambulate independently  

## 2016-04-11 NOTE — ED Triage Notes (Signed)
Complains of headache, particularly left back of head and neck.  Patient reports pain started 7/29.  Dizziness has been intermittent.

## 2016-04-11 NOTE — ED Triage Notes (Signed)
Pt sent from [facility] for eval of severe HA x 4 days and some dizziness; pt denies hx of same in past

## 2016-04-11 NOTE — ED Provider Notes (Signed)
[facility]    CSN: [id number] Arrival date & time: 04/11/16  1354  First Provider Contact:  First MD Initiated Contact with Patient 04/11/16 1510        History   Chief Complaint Chief Complaint  Patient presents with  . Headache  . Dizziness    HPI Justin Walls is a 40 y.o. male.    Headache  Pain location:  Occipital Quality:  Sharp Radiates to:  Does not radiate Severity currently:  5/10 Severity at highest:  10/10 Onset quality:  Sudden Duration:  10 days Progression:  Worsening Chronicity:  Recurrent Similar to prior headaches: no   Associated symptoms: dizziness   Associated symptoms: no fever, no nausea, no neck pain, no photophobia and no vomiting     Past Medical History:  Diagnosis Date  . Asthma   . Hypertension     There are no active problems to display for this patient.   Past Surgical History:  Procedure Laterality Date  . HIP SURGERY    . hip sx         Home Medications    Prior to Admission medications   Medication Sig Start Date End Date Taking? Authorizing Provider  albuterol (PROVENTIL HFA;VENTOLIN HFA) 108 (90 BASE) MCG/ACT inhaler Inhale 1-2 puffs into the lungs every 6 (six) hours as needed for wheezing. 10/03/12   Reuben Likes, MD  azithromycin (ZITHROMAX Z-PAK) 250 MG tablet Take as directed. Patient not taking: Reported on 01/31/2015 10/03/12   Reuben Likes, MD  docusate sodium (COLACE) 100 MG capsule Take 1 capsule (100 mg total) by mouth daily as needed for mild constipation. 01/31/15   Purvis Sheffield, MD  HYDROcodone-homatropine [facility]) 5-1.5 MG/5ML syrup Take 5 mLs by mouth every 4 (four) hours as needed for cough. Patient not taking: Reported on 01/31/2015 10/05/12   Elson Areas, PA-C  lidocaine (XYLOCAINE) 2 % solution Apply to affected area using cotton swab every 3 hours as needed for pain Patient not taking: Reported on 01/31/2015 10/29/14   Ria Clock, PA  naproxen (NAPROSYN) 500 MG  tablet Take 1 tablet (500 mg total) by mouth 2 (two) times daily with a meal. As needed for pain Patient not taking: Reported on 01/31/2015 10/29/14   Ria Clock, PA  oxyCODONE-acetaminophen (PERCOCET) 5-325 MG per tablet Take 1-2 tablets by mouth every 6 (six) hours as needed for moderate pain. 01/31/15   Purvis Sheffield, MD    Family History No family history on file.  Social History Social History  Substance Use Topics  . Smoking status: Never Smoker  . Smokeless tobacco: Not on file  . Alcohol use Yes     Allergies   Amoxicillin   Review of Systems Review of Systems  Constitutional: Negative.  Negative for fever.  Eyes: Negative for photophobia.  Respiratory: Negative.   Cardiovascular: Negative.   Gastrointestinal: Negative.  Negative for nausea and vomiting.  Musculoskeletal: Negative for neck pain.  Neurological: Positive for dizziness and headaches.  Hematological: Negative.   All other systems reviewed and are negative.    Physical Exam Triage Vital Signs ED Triage Vitals  Enc Vitals Group     BP 04/11/16 1443 152/84     Pulse Rate 04/11/16 1443 67     Resp 04/11/16 1443 16     Temp 04/11/16 1443 98.9 F (37.2 C)     Temp Source 04/11/16 1443 Oral     SpO2 04/11/16 1443 100 %  Weight --      Height --      Head Circumference --      Peak Flow --      Pain Score 04/11/16 1455 5     Pain Loc --      Pain Edu? --      Excl. in GC? --    No data found.   Updated Vital Signs BP 152/84 (BP Location: Left Arm)   Pulse 67   Temp 98.9 F (37.2 C) (Oral)   Resp 16   SpO2 100%   Visual Acuity Right Eye Distance:   Left Eye Distance:   Bilateral Distance:    Right Eye Near:   Left Eye Near:    Bilateral Near:     Physical Exam  Constitutional: He is oriented to person, place, and time. He appears well-developed and well-nourished. He appears distressed.  HENT:  Head: Normocephalic and atraumatic.  Right Ear: External ear normal.   Left Ear: External ear normal.  Nose: Nose normal.  Mouth/Throat: Oropharynx is clear and moist.  Eyes: Conjunctivae and EOM are normal. Pupils are equal, round, and reactive to light.  Neck: Normal range of motion. Neck supple.  Cardiovascular: Normal rate and normal heart sounds.   Pulmonary/Chest: Effort normal and breath sounds normal.  Musculoskeletal: Normal range of motion.  Neurological: He is alert and oriented to person, place, and time. He has normal reflexes. No cranial nerve deficit. Coordination normal.  Skin: Skin is warm and dry.  Nursing note and vitals reviewed.    UC Treatments / Results  Labs (all labs ordered are listed, but only abnormal results are displayed) Labs Reviewed - No data to display  EKG  EKG Interpretation None       Radiology No results found.  Procedures Procedures (including critical care time)  Medications Ordered in UC Medications - No data to display   Initial Impression / Assessment and Plan / UC Course  I have reviewed the triage vital signs and the nursing notes.  Pertinent labs & imaging results that were available during my care of the patient were reviewed by me and considered in my medical decision making (see chart for details).  Clinical Course  sent for eval of left occip headache with dizziness, onset 10 dago resolved then recurred more seriously on sat, persists., worse lying down.  Final Clinical Impressions(s) / UC Diagnoses   Final diagnoses:  None    New Prescriptions New Prescriptions   No medications on file     Linna Hoff, MD 04/11/16 1521

## 2016-04-11 NOTE — ED Notes (Signed)
PA at bedside updating pt 

## 2016-04-11 NOTE — ED Notes (Signed)
Morrie Sheldon accepted report

## 2016-04-11 NOTE — ED Provider Notes (Signed)
[facility] Provider Note   CSN: [id number] Arrival date & time: 04/11/16  1608  First Provider Contact:   First MD Initiated Contact with Patient 04/11/16 1937      By signing my name below, I, Justin Walls, attest that this documentation has been prepared under the direction and in the presence of Justin Pili, PA-C Electronically Signed: Soijett Walls, ED Scribe. 04/11/16. 7:46 PM.   History   Chief Complaint Chief Complaint  Patient presents with  . Migraine    HPI  Justin Walls is a 40 y.o. male with a medical hx of HTN who presents to the Emergency Department complaining of left occipital HA onset 3 days. Pt notes that he was using the restroom when he had occipital headache on left side. He reports that his HA is worsened with lights. Denies alleviating factors. Pt denies typically getting headaches. He states that he is having associated symptoms of blurred vision to left eye, photophobia, and nausea. He states that he has not tried any medications for the relief for his symptoms. He denies left eye pain, vomiting, CP, SOB, abdominal pain, and any other symptoms. Pt notes that he is allergic to amoxicillin.    Per pt chart review: Pt was seen at [facility] on 04/11/2016 for migraine. Pt was referred to the ED for further evaluation.    The history is provided by the patient. No language interpreter was used.    Past Medical History:  Diagnosis Date  . Asthma   . Hypertension     There are no active problems to display for this patient.   Past Surgical History:  Procedure Laterality Date  . HIP SURGERY    . hip sx         Home Medications    Prior to Admission medications   Medication Sig Start Date End Date Taking? Authorizing Provider  albuterol (PROVENTIL HFA;VENTOLIN HFA) 108 (90 BASE) MCG/ACT inhaler Inhale 1-2 puffs into the lungs every 6 (six) hours as needed for wheezing. 10/03/12   Reuben Likes, MD  azithromycin (ZITHROMAX Z-PAK) 250  MG tablet Take as directed. Patient not taking: Reported on 01/31/2015 10/03/12   Reuben Likes, MD  docusate sodium (COLACE) 100 MG capsule Take 1 capsule (100 mg total) by mouth daily as needed for mild constipation. 01/31/15   Purvis Sheffield, MD  HYDROcodone-homatropine [facility]) 5-1.5 MG/5ML syrup Take 5 mLs by mouth every 4 (four) hours as needed for cough. Patient not taking: Reported on 01/31/2015 10/05/12   Elson Areas, PA-C  lidocaine (XYLOCAINE) 2 % solution Apply to affected area using cotton swab every 3 hours as needed for pain Patient not taking: Reported on 01/31/2015 10/29/14   Ria Clock, PA  naproxen (NAPROSYN) 500 MG tablet Take 1 tablet (500 mg total) by mouth 2 (two) times daily with a meal. As needed for pain Patient not taking: Reported on 01/31/2015 10/29/14   Ria Clock, PA  oxyCODONE-acetaminophen (PERCOCET) 5-325 MG per tablet Take 1-2 tablets by mouth every 6 (six) hours as needed for moderate pain. 01/31/15   Purvis Sheffield, MD    Family History History reviewed. No pertinent family history.  Social History Social History  Substance Use Topics  . Smoking status: Never Smoker  . Smokeless tobacco: Not on file  . Alcohol use Yes     Allergies   Amoxicillin   Review of Systems Review of Systems  A complete 10 system review of systems  was obtained and all systems are negative except as noted in the HPI and PMH.   Physical Exam Updated Vital Signs BP 143/88   Pulse 60   Temp 98.6 F (37 C)   Resp 18   SpO2 100%   Physical Exam  Constitutional: He is oriented to person, place, and time. He appears well-developed and well-nourished. No distress.  HENT:  Head: Normocephalic and atraumatic.  Eyes: EOM are normal.  Neck: Neck supple.  Cardiovascular: Normal rate.   Pulmonary/Chest: Effort normal. No respiratory distress.  Abdominal: He exhibits no distension.  Musculoskeletal: Normal range of motion.  Neurological: He is  alert and oriented to person, place, and time.  Mental Status:  Alert, oriented, thought content appropriate, able to give a coherent history. Speech fluent without evidence of aphasia.  Cranial Nerves:  II:  Peripheral visual fields grossly normal, pupils equal, round, reactive to light III,IV, VI: ptosis not present, extra-ocular motions intact bilaterally  V,VII: smile symmetric, facial light touch sensation equal VIII: hearing grossly normal to voice  X: uvula elevates symmetrically  XI: bilateral shoulder shrug symmetric and strong XII: midline tongue extension without fassiculations Motor:  Normal tone. 5/5 in upper and lower extremities bilaterally including strong and equal grip strength and dorsiflexion/plantar flexion Cerebellar: normal finger-to-nose with bilateral upper extremities Gait: normal gait and balance CV: distal pulses palpable throughout   Skin: Skin is warm and dry.  Psychiatric: He has a normal mood and affect. His behavior is normal.  Nursing note and vitals reviewed.   ED Treatments / Results  DIAGNOSTIC STUDIES: Oxygen Saturation is 100% on RA, nl by my interpretation.    COORDINATION OF CARE: 7:40 PM Discussed treatment plan with pt at bedside which includes toradol injection, reglan injection, benadryl, decadron, and CT head, and pt agreed to plan.   Radiology Ct Head Wo Contrast  Result Date: 04/11/2016 CLINICAL DATA:  Left-sided headache, dizziness, some blurred vision on the left EXAM: CT HEAD WITHOUT CONTRAST TECHNIQUE: Contiguous axial images were obtained from the base of the skull through the vertex without intravenous contrast. COMPARISON:  None. FINDINGS: The ventricular system is normal in size and configuration, and the septum is midline in position. The fourth ventricle and basilar cisterns are unremarkable. No hemorrhage, mass lesion, or acute infarction is seen. On bone window images, no calvarial abnormality is seen. IMPRESSION: Negative  unenhanced CT of the brain. Electronically Signed   By: Dwyane Dee M.D.   On: 04/11/2016 17:08    Procedures Procedures (including critical care time)  Medications Ordered in ED Medications - No data to display   Initial Impression / Assessment and Plan / ED Course  I have reviewed the triage vital signs and the nursing notes.  Pertinent imaging results that were available during my care of the patient were reviewed by me and considered in my medical decision making (see chart for details).  Clinical Course   9:11 PM- Pt visualized ambulating to the restroom with no difficulty or assistance. Pt re-evaluated with complete relief of his symptoms.    Final Clinical Impressions(s) / ED Diagnoses  I have reviewed and evaluated the relevant imaging studies.  I have reviewed the relevant previous healthcare records. I obtained HPI from historian.  ED Course:  Assessment: Patient is a 40yM that presents with headache since Saturday. Patient is without high-risk features of headache including: Sudden onset/thunderclap HA, No similar headache in past, Altered mental status, Accompanying seizure, Headache with exertion, Age > 50, History of  immunocompromise, Neck or shoulder pain, Fever, Use of anticoagulation, Family history of spontaneous SAH, Concomitant drug use, Toxic exposure.  Patient has a normal complete neurological exam, normal vital signs, normal level of consciousness, no signs of meningismus, is well-appearing/non-toxic appearing, no signs of trauma. No papilledema, no pain over the temporal arteries. CT Head unreamrkable. No dangerous or life-threatening conditions suspected or identified by history, physical exam, and by work-up. Given Toradol, benadryl, regal with complete relief of symptoms. No indications for hospitalization identified.   Disposition/Plan:  DC Home Additional Verbal discharge instructions given and discussed with patient.  Pt Instructed to f/u with PCP in the  next week for evaluation and treatment of symptoms. Return precautions given Pt acknowledges and agrees with plan  Supervising Physician Margarita Grizzle, MD   Final diagnoses:  Nonintractable headache, unspecified chronicity pattern, unspecified headache type    New Prescriptions New Prescriptions   No medications on file   I personally performed the services described in this documentation, which was scribed in my presence. The recorded information has been reviewed and is accurate.      Justin Pili, PA-C 04/11/16 2113    Margarita Grizzle, MD 04/15/16 [phone]

## 2016-04-11 NOTE — ED Notes (Signed)
Family member arrived to take children, patient transferred to ed by shuttle

## 2016-04-11 NOTE — Discharge Instructions (Signed)
Please read and follow all provided instructions.  Your diagnoses today include:  1. Nonintractable headache, unspecified chronicity pattern, unspecified headache type     Tests performed today include: CT of your head which was normal and did not show any serious cause of your headache Vital signs. See below for your results today.   Medications:  In the Emergency Department you received: Reglan - antinausea/headache medication Benadryl - antihistamine to counteract potential side effects of reglan Toradol - NSAID medication similar to ibuprofen  Take any prescribed medications only as directed.  Additional information:  Follow any educational materials contained in this packet.  You are having a headache. No specific cause was found today for your headache. It may have been a migraine or other cause of headache. Stress, anxiety, fatigue, and depression are common triggers for headaches.   Your headache today does not appear to be life-threatening or require hospitalization, but often the exact cause of headaches is not determined in the emergency department. Therefore, follow-up with your doctor is very important to find out what may have caused your headache and whether or not you need any further diagnostic testing or treatment.   Sometimes headaches can appear benign (not harmful), but then more serious symptoms can develop which should prompt an immediate re-evaluation by your doctor or the emergency department.  BE VERY CAREFUL not to take multiple medicines containing Tylenol (also called acetaminophen). Doing so can lead to an overdose which can damage your liver and cause liver failure and possibly death.   Follow-up instructions: Please follow-up with your primary care provider in the next 3 days for further evaluation of your symptoms.   Return instructions:  Please return to the Emergency Department if you experience worsening symptoms. Return if the medications do not  resolve your headache, if it recurs, or if you have multiple episodes of vomiting or cannot keep down fluids. Return if you have a change from the usual headache. RETURN IMMEDIATELY IF you: Develop a sudden, severe headache Develop confusion or become poorly responsive or faint Develop a fever above 100.21F or problem breathing Have a change in speech, vision, swallowing, or understanding Develop new weakness, numbness, tingling, incoordination in your arms or legs Have a seizure Please return if you have any other emergent concerns.  Additional Information:  Your vital signs today were: BP (!) 159/103 (BP Location: Left Arm)    Pulse 87    Temp 97.7 F (36.5 C) (Oral)    Resp 12    SpO2 100%  If your blood pressure (BP) was elevated above 135/85 this visit, please have this repeated by your doctor within one month. --------------

## 2017-02-23 ENCOUNTER — Ambulatory Visit (INDEPENDENT_AMBULATORY_CARE_PROVIDER_SITE_OTHER): Payer: Self-pay

## 2017-02-23 ENCOUNTER — Ambulatory Visit (HOSPITAL_COMMUNITY)
Admission: EM | Admit: 2017-02-23 | Discharge: 2017-02-23 | Disposition: A | Discharge status: Home / Self Care | Payer: Self-pay | Attending: Internal Medicine | Admitting: Internal Medicine

## 2017-02-23 ENCOUNTER — Encounter (HOSPITAL_COMMUNITY): Payer: Self-pay | Admitting: *Deleted

## 2017-02-23 DIAGNOSIS — S63681A Other sprain of right thumb, initial encounter: Secondary | ICD-10-CM

## 2017-02-23 MED ORDER — KETOROLAC TROMETHAMINE 30 MG/ML IJ SOLN
INTRAMUSCULAR | Status: AC
  Filled 2017-02-23: qty 1

## 2017-02-23 MED ORDER — NAPROXEN 500 MG PO TABS: 500.0000 mg | ORAL_TABLET | Freq: Two times a day (BID) | ORAL | 0 refills | Status: DC

## 2017-02-23 MED ORDER — KETOROLAC TROMETHAMINE 30 MG/ML IJ SOLN
30.0000 mg | Freq: Once | INTRAMUSCULAR | Status: AC
  Administered 2017-02-23: 30 mg via INTRAMUSCULAR

## 2017-02-23 NOTE — ED Triage Notes (Signed)
Pt  Reports  Pain      In       His  r  Thumb       -      States      He  May  Have  Injured  It  6  Days  Ago    While  Lifting a  Grill  He  Has  Pain and  Swelling  Present

## 2017-02-23 NOTE — ED Provider Notes (Signed)
CSN: [id number]     Arrival date & time 02/23/17  1030 History   None    Chief Complaint  Patient presents with  . Hand Injury   (Consider location/radiation/quality/duration/timing/severity/associated sxs/prior Treatment) Patient c/o right thumb pain from injury 6 days ago.  He was lifting a grill and developed pain and swelling in right thumb.   The history is provided by the patient.  Hand Injury  Location:  Finger Finger location:  R thumb Injury: yes   Time since incident:  6 days Pain details:    Quality:  Aching   Radiates to:  Does not radiate   Severity:  Moderate   Onset quality:  Sudden   Duration:  6 days   Timing:  Constant   Progression:  Worsening Handedness:  Right-handed Dislocation: no   Foreign body present:  No foreign bodies Tetanus status:  Unknown Prior injury to area:  Yes Relieved by:  Nothing Worsened by:  Nothing   Past Medical History:  Diagnosis Date  . Asthma   . Hypertension    Past Surgical History:  Procedure Laterality Date  . HIP SURGERY    . hip sx     History reviewed. No pertinent family history. Social History  Substance Use Topics  . Smoking status: Never Smoker  . Smokeless tobacco: Not on file  . Alcohol use Yes    Review of Systems  Constitutional: Negative.   HENT: Negative.   Eyes: Negative.   Respiratory: Negative.   Cardiovascular: Negative.   Gastrointestinal: Negative.   Endocrine: Negative.   Genitourinary: Negative.   Musculoskeletal: Positive for arthralgias.  Allergic/Immunologic: Negative.   Neurological: Negative.   Hematological: Negative.   Psychiatric/Behavioral: Negative.     Allergies  Amoxicillin  Home Medications   Prior to Admission medications   Medication Sig Start Date End Date Taking? Authorizing Provider  albuterol (PROVENTIL HFA;VENTOLIN HFA) 108 (90 BASE) MCG/ACT inhaler Inhale 1-2 puffs into the lungs every 6 (six) hours as needed for wheezing. 10/03/12   Reuben LikesKeller, David C,  MD  azithromycin (ZITHROMAX Z-PAK) 250 MG tablet Take as directed. Patient not taking: Reported on 01/31/2015 10/03/12   Reuben LikesKeller, David C, MD  docusate sodium (COLACE) 100 MG capsule Take 1 capsule (100 mg total) by mouth daily as needed for mild constipation. 01/31/15   Purvis SheffieldHarrison, Forrest, MD  HYDROcodone-homatropine [facility](HYCODAN) 5-1.5 MG/5ML syrup Take 5 mLs by mouth every 4 (four) hours as needed for cough. Patient not taking: Reported on 01/31/2015 10/05/12   Elson AreasSofia, Leslie K, PA-C  lidocaine (XYLOCAINE) 2 % solution Apply to affected area using cotton swab every 3 hours as needed for pain Patient not taking: Reported on 01/31/2015 10/29/14   Presson, Mathis FareJennifer Lee H, PA  naproxen (NAPROSYN) 500 MG tablet Take 1 tablet (500 mg total) by mouth 2 (two) times daily with a meal. As needed for pain Patient not taking: Reported on 01/31/2015 10/29/14   Ria ClockPresson, Jennifer Lee H, PA  naproxen (NAPROSYN) 500 MG tablet Take 1 tablet (500 mg total) by mouth 2 (two) times daily with a meal. 02/23/17   Deatra Canterxford, Vilas Edgerly J, FNP  oxyCODONE-acetaminophen (PERCOCET) 5-325 MG per tablet Take 1-2 tablets by mouth every 6 (six) hours as needed for moderate pain. 01/31/15   Purvis SheffieldHarrison, Forrest, MD   Meds Ordered and Administered this Visit  Medications - No data to display  BP (!) 142/78 (BP Location: Right Arm)   Pulse 78   Temp 98.6 F (37 C) (Oral)   Resp  18   SpO2 100%  No data found.   Physical Exam  Constitutional: He appears well-developed and well-nourished.  HENT:  Head: Normocephalic and atraumatic.  Eyes: Conjunctivae and EOM are normal. Pupils are equal, round, and reactive to light.  Neck: Normal range of motion. Neck supple.  Cardiovascular: Normal rate, regular rhythm and normal heart sounds.   Pulmonary/Chest: Effort normal and breath sounds normal.  Musculoskeletal: He exhibits tenderness.  Right thumb with swelling and tenderness.  Nursing note and vitals reviewed.   Urgent Care Course      Procedures (including critical care time)  Labs Review Labs Reviewed - No data to display  Imaging Review Dg Hand Complete Right  Result Date: 02/23/2017 CLINICAL DATA:  Crush injury 3 days ago. Pain and swelling of the thumb and index finger. EXAM: RIGHT HAND - COMPLETE 3+ VIEW COMPARISON:  None. FINDINGS: There is no evidence of fracture or dislocation. There is no evidence of arthropathy or other focal bone abnormality. Soft tissues are unremarkable. IMPRESSION: Negative. Electronically Signed   By: Paulina Fusi M.D.   On: 02/23/2017 11:17     Visual Acuity Review  Right Eye Distance:   Left Eye Distance:   Bilateral Distance:    Right Eye Near:   Left Eye Near:    Bilateral Near:         MDM   1. Other sprain of right thumb, initial encounter    Naprosyn 500 mg one po bid x 10 days #20 Right thumb spica splint      Deatra Canter, FNP 02/23/17 1137

## 2018-06-20 ENCOUNTER — Ambulatory Visit (HOSPITAL_COMMUNITY)
Admission: EM | Admit: 2018-06-20 | Discharge: 2018-06-20 | Disposition: A | Discharge status: Home / Self Care | Payer: Self-pay | Attending: Family Medicine | Admitting: Family Medicine

## 2018-06-20 ENCOUNTER — Encounter (HOSPITAL_COMMUNITY): Payer: Self-pay | Admitting: Emergency Medicine

## 2018-06-20 DIAGNOSIS — K047 Periapical abscess without sinus: Secondary | ICD-10-CM

## 2018-06-20 MED ORDER — CLINDAMYCIN HCL 300 MG PO CAPS: 300.0000 mg | ORAL_CAPSULE | Freq: Three times a day (TID) | ORAL | 0 refills | Status: AC

## 2018-06-20 NOTE — ED Triage Notes (Signed)
Dental abscess on right side that started Saturday.

## 2018-06-20 NOTE — ED Provider Notes (Signed)
[facility]    CSN: [id number] Arrival date & time: 06/20/18  1213     History   Chief Complaint Chief Complaint  Patient presents with  . Abscess    HPI Justin Walls is a 42 y.o. male history of asthma and hypertension presenting today for evaluation of dental abscess.  Patient states that since Saturday he has started to notice pain and swelling to his right upper jaw.  Feels like similar previous dental abscess.  Notes that he has a fractured tooth on that side of his mouth.  Tried to contact dentistry, but needed to be reestablished and could not be seen soon.  Denies fevers.  Denies difficulty moving neck, denies difficulty swallowing.  He is not taking anything for the discomfort.  Has had some mild URI symptoms congestion, cough, his symptoms started separately from pain and swelling.  HPI  Past Medical History:  Diagnosis Date  . Asthma   . Hypertension     There are no active problems to display for this patient.   Past Surgical History:  Procedure Laterality Date  . HIP SURGERY    . hip sx         Home Medications    Prior to Admission medications   Medication Sig Start Date End Date Taking? Authorizing Provider  albuterol (PROVENTIL HFA;VENTOLIN HFA) 108 (90 BASE) MCG/ACT inhaler Inhale 1-2 puffs into the lungs every 6 (six) hours as needed for wheezing. 10/03/12   Reuben Likes, MD  clindamycin (CLEOCIN) 300 MG capsule Take 1 capsule (300 mg total) by mouth 3 (three) times daily for 7 days. 06/20/18 06/27/18  Bertrice Leder C, PA-C  docusate sodium (COLACE) 100 MG capsule Take 1 capsule (100 mg total) by mouth daily as needed for mild constipation. 01/31/15   Purvis Sheffield, MD  naproxen (NAPROSYN) 500 MG tablet Take 1 tablet (500 mg total) by mouth 2 (two) times daily with a meal. 02/23/17   [place], Anselm Pancoast, FNP  oxyCODONE-acetaminophen (PERCOCET) 5-325 MG per tablet Take 1-2 tablets by mouth every 6 (six) hours as needed for moderate pain.  01/31/15   Purvis Sheffield, MD    Family History No family history on file.  Social History Social History   Tobacco Use  . Smoking status: Never Smoker  Substance Use Topics  . Alcohol use: Yes  . Drug use: Yes    Types: Marijuana    Comment: twice a month     Allergies   Amoxicillin   Review of Systems Review of Systems  Constitutional: Negative for activity change, appetite change, chills, fatigue and fever.  HENT: Positive for dental problem. Negative for congestion, ear pain, rhinorrhea, sinus pressure, sore throat and trouble swallowing.   Eyes: Negative for discharge and redness.  Respiratory: Negative for cough, chest tightness and shortness of breath.   Cardiovascular: Negative for chest pain.  Gastrointestinal: Negative for abdominal pain, diarrhea, nausea and vomiting.  Musculoskeletal: Negative for myalgias.  Skin: Negative for rash.  Neurological: Negative for dizziness, light-headedness and headaches.     Physical Exam Triage Vital Signs ED Triage Vitals  Enc Vitals Group     BP 06/20/18 1342 (!) 151/104     Pulse Rate 06/20/18 1342 85     Resp 06/20/18 1342 16     Temp 06/20/18 1342 98.9 F (37.2 C)     Temp Source 06/20/18 1342 Oral     SpO2 06/20/18 1342 97 %     Weight --  Height --      Head Circumference --      Peak Flow --      Pain Score 06/20/18 1341 8     Pain Loc --      Pain Edu? --      Excl. in GC? --    No data found.  Updated Vital Signs BP (!) 151/104   Pulse 85   Temp 98.9 F (37.2 C) (Oral)   Resp 16   SpO2 97%   Visual Acuity Right Eye Distance:   Left Eye Distance:   Bilateral Distance:    Right Eye Near:   Left Eye Near:    Bilateral Near:     Physical Exam  Constitutional: He appears well-developed and well-nourished.  HENT:  Head: Normocephalic and atraumatic.  Bilateral ears without tenderness to palpation of external auricle, tragus and mastoid, EAC's without erythema or swelling, TM's with  good bony landmarks and cone of light. Non erythematous.  Mild swelling to right maxillary area of face compared to left, right upper posterior molar fractured, no gingival tenderness immediately adjacent to this area, but patient has tenderness to palpation in vestibule of lip on right posterior side  Eyes: Conjunctivae are normal.  Neck: Neck supple.  Cardiovascular: Normal rate and regular rhythm.  No murmur heard. Pulmonary/Chest: Effort normal and breath sounds normal. No respiratory distress.  Breathing comfortably at rest, CTABL, no wheezing, rales or other adventitious sounds auscultated  Abdominal: Soft. There is no tenderness.  Musculoskeletal: He exhibits no edema.  Neurological: He is alert.  Skin: Skin is warm and dry.  Psychiatric: He has a normal mood and affect.  Nursing note and vitals reviewed.    UC Treatments / Results  Labs (all labs ordered are listed, but only abnormal results are displayed) Labs Reviewed - No data to display  EKG None  Radiology No results found.  Procedures Procedures (including critical care time)  Medications Ordered in UC Medications - No data to display  Initial Impression / Assessment and Plan / UC Course  I have reviewed the triage vital signs and the nursing notes.  Pertinent labs & imaging results that were available during my care of the patient were reviewed by me and considered in my medical decision making (see chart for details).     Patient has mild swelling, near problematic tooth.  Likely dental abscess/infection.  Will treat with clindamycin as patient has allergies to amoxicillin.  Dental resource provided.  Tylenol and ibuprofen for pain and swelling.  Warm compresses.  Monitor swelling and pain, follow-up if symptoms worsening, changing or not improving.Discussed strict return precautions. Patient verbalized understanding and is agreeable with plan.  Final Clinical Impressions(s) / UC Diagnoses   Final  diagnoses:  Dental abscess     Discharge Instructions     Please use dental resource to contact offices to seek permenant treatment/relief.   Today we have given you an antibiotic. This should help with pain as any infection is cleared.   For pain please take 600mg -800mg  of Ibuprofen every 8 hours, take with 1000 mg of Tylenol Extra strength every 8 hours. These are safe to take together. Please take with food.   Please return if you start to experience significant swelling of your face, neck stiffness, experiencing fever.   ED Prescriptions    Medication Sig Dispense Auth. Provider   clindamycin (CLEOCIN) 300 MG capsule Take 1 capsule (300 mg total) by mouth 3 (three) times daily for 7 days.  21 capsule Klaira Pesci C, PA-C     Controlled Substance Prescriptions [place] Controlled Substance Registry consulted? Not Applicable   Lew Dawes, [place] 06/20/18 1357

## 2018-06-20 NOTE — Discharge Instructions (Signed)
Please use dental resource to contact offices to seek permenant treatment/relief.   Today we have given you an antibiotic. This should help with pain as any infection is cleared.   For pain please take 600mg -800mg  of Ibuprofen every 8 hours, take with 1000 mg of Tylenol Extra strength every 8 hours. These are safe to take together. Please take with food.   Please return if you start to experience significant swelling of your face, neck stiffness, experiencing fever.

## 2019-04-14 ENCOUNTER — Other Ambulatory Visit: Payer: Self-pay

## 2019-04-14 DIAGNOSIS — Z20822 Contact with and (suspected) exposure to covid-19: Secondary | ICD-10-CM

## 2019-04-15 LAB — NOVEL CORONAVIRUS, NAA: SARS-CoV-2, NAA: NOT DETECTED

## 2019-05-08 ENCOUNTER — Ambulatory Visit (HOSPITAL_COMMUNITY)
Admission: EM | Admit: 2019-05-08 | Discharge: 2019-05-08 | Disposition: A | Discharge status: Home / Self Care | Payer: Self-pay | Attending: Internal Medicine | Admitting: Internal Medicine

## 2019-05-08 ENCOUNTER — Other Ambulatory Visit: Payer: Self-pay

## 2019-05-08 ENCOUNTER — Ambulatory Visit (INDEPENDENT_AMBULATORY_CARE_PROVIDER_SITE_OTHER): Payer: Self-pay

## 2019-05-08 ENCOUNTER — Encounter (HOSPITAL_COMMUNITY): Payer: Self-pay

## 2019-05-08 DIAGNOSIS — M25511 Pain in right shoulder: Secondary | ICD-10-CM

## 2019-05-08 DIAGNOSIS — I10 Essential (primary) hypertension: Secondary | ICD-10-CM

## 2019-05-08 MED ORDER — KETOROLAC TROMETHAMINE 60 MG/2ML IM SOLN
INTRAMUSCULAR | Status: AC
  Filled 2019-05-08: qty 2

## 2019-05-08 MED ORDER — KETOROLAC TROMETHAMINE 60 MG/2ML IM SOLN
60.0000 mg | Freq: Once | INTRAMUSCULAR | Status: AC
  Administered 2019-05-08: 60 mg via INTRAMUSCULAR

## 2019-05-08 MED ORDER — METHYLPREDNISOLONE ACETATE 80 MG/ML IJ SUSP
INTRAMUSCULAR | Status: AC
  Filled 2019-05-08: qty 1

## 2019-05-08 MED ORDER — TRAMADOL HCL 50 MG PO TABS: 50.0000 mg | ORAL_TABLET | Freq: Two times a day (BID) | ORAL | 0 refills | Status: AC | PRN

## 2019-05-08 NOTE — ED Triage Notes (Signed)
Patient presents to Urgent Care with complaints of right shoulder pain since yesterday morning. Patient reports he thinks he slept on it funny, denies known injury. Pt is able to move fingers, severe pain when lifting arm.

## 2019-05-08 NOTE — Discharge Instructions (Addendum)
Steroid injection given here in clinic into the shoulder. Rest, apply ice a few times a day for least 20 minutes at a time. We also gave you a Toradol injection here in clinic for pain. Tramadol sent to the pharmacy for more severe pain For any continued or worsening symptoms you need to follow-up with orthopedic specialist.

## 2019-05-08 NOTE — ED Provider Notes (Addendum)
[facility]    CSN: [id number] Arrival date & time: 05/08/19  1242      History   Chief Complaint Chief Complaint  Patient presents with  . Shoulder Pain    Right    HPI Justin Walls is a 43 y.o. male.   Patient is a 43 year old male past medical history of asthma, hypertension.  He presents today with severe right shoulder pain.  This has been present since waking up this morning.  Reporting that the day prior he did some yard work.  He did sleep on his right side last night.  Denies any falls, injuries.  There is swelling and limited range of motion.  Denies any fever.  Denies any drug use.  No numbness, tingling or radiation of pain.  ROS per HPI      Past Medical History:  Diagnosis Date  . Asthma   . Hypertension     There are no active problems to display for this patient.   Past Surgical History:  Procedure Laterality Date  . HIP SURGERY    . hip sx         Home Medications    Prior to Admission medications   Medication Sig Start Date End Date Taking? Authorizing Provider  docusate sodium (COLACE) 100 MG capsule Take 1 capsule (100 mg total) by mouth daily as needed for mild constipation. 01/31/15   Purvis Sheffield, MD  traMADol (ULTRAM) 50 MG tablet Take 1 tablet (50 mg total) by mouth every 12 (twelve) hours as needed for up to 3 days. 05/08/19 05/11/19  Janace Aris, NP  albuterol (PROVENTIL HFA;VENTOLIN HFA) 108 (90 BASE) MCG/ACT inhaler Inhale 1-2 puffs into the lungs every 6 (six) hours as needed for wheezing. 10/03/12 05/08/19  Reuben Likes, MD    Family History Family History  Problem Relation Age of Onset  . Diabetes Mother   . Hypertension Mother   . Healthy Father     Social History Social History   Tobacco Use  . Smoking status: Never Smoker  . Smokeless tobacco: Never Used  Substance Use Topics  . Alcohol use: Yes  . Drug use: Yes    Types: Marijuana    Comment: twice a month     Allergies   Amoxicillin    Review of Systems Review of Systems   Physical Exam Triage Vital Signs ED Triage Vitals  Enc Vitals Group     BP 05/08/19 1252 (!) 198/117     Pulse Rate 05/08/19 1252 80     Resp 05/08/19 1252 17     Temp 05/08/19 1252 98.3 F (36.8 C)     Temp Source 05/08/19 1252 Oral     SpO2 05/08/19 1252 100 %     Weight --      Height --      Head Circumference --      Peak Flow --      Pain Score 05/08/19 1249 10     Pain Loc --      Pain Edu? --      Excl. in GC? --    No data found.  Updated Vital Signs BP (!) 198/117 (BP Location: Left Arm) Comment: 10/10 pain, sweating profusely due to pain  Pulse 80   Temp 98.3 F (36.8 C) (Oral)   Resp 17   SpO2 100%   Visual Acuity Right Eye Distance:   Left Eye Distance:   Bilateral Distance:    Right Eye Near:  Left Eye Near:    Bilateral Near:     Physical Exam Vitals signs and nursing note reviewed.  Constitutional:      Comments: appears in pain   HENT:     Head: Normocephalic and atraumatic.     Nose: Nose normal.  Eyes:     Conjunctiva/sclera: Conjunctivae normal.  Neck:     Musculoskeletal: Normal range of motion.  Pulmonary:     Effort: Pulmonary effort is normal.  Musculoskeletal:        General: Swelling, tenderness and deformity present.     Comments: Swelling noted to right shoulder mostly anterior.  Generalized tenderness. Most tenderness with palpation of the glenohumeral joint.  Limited range of motion and unable to perform internal and external rotation of the right shoulder.   Skin:    General: Skin is warm and dry.  Neurological:     Mental Status: He is alert.  Psychiatric:        Mood and Affect: Mood normal.      UC Treatments / Results  Labs (all labs ordered are listed, but only abnormal results are displayed) Labs Reviewed - No data to display  EKG   Radiology Dg Clavicle Right  Result Date: 05/08/2019 CLINICAL DATA:  Acute right shoulder pain without known injury. EXAM: RIGHT  CLAVICLE - 2+ VIEWS COMPARISON:  None. FINDINGS: There is no evidence of fracture or other focal bone lesions. Soft tissues are unremarkable. IMPRESSION: Negative. Electronically Signed   By: Lupita RaiderJames  Green Jr M.D.   On: 05/08/2019 13:30    Procedures Join Aspiration/Injection  Date/Time: 05/08/2019 2:43 PM Performed by: Janace ArisBast, Reign Bartnick A, NP Authorized by: Janace ArisBast, Marisa Hage A, NP   Consent:    Consent obtained:  Verbal   Consent given by:  Patient   Risks discussed:  Bleeding, infection and pain   Alternatives discussed:  No treatment Location:    Location:  Shoulder   Shoulder:  R subacromial bursa Anesthesia (see MAR for exact dosages):    Anesthesia method:  Topical application and local infiltration   Local anesthetic:  Lidocaine 2% w/o epi Procedure details:    Needle gauge:  22 G   Ultrasound guidance: no     Approach:  Posterior   Steroid injected: yes     Specimen collected: no   Post-procedure details:    Dressing:  Adhesive bandage   Patient tolerance of procedure:  Tolerated well, no immediate complications   (including critical care time)  Medications Ordered in UC Medications  ketorolac (TORADOL) injection 60 mg (60 mg Intramuscular Given 05/08/19 1300)  ketorolac (TORADOL) 60 MG/2ML injection (has no administration in time range)  methylPREDNISolone acetate (DEPO-MEDROL) 80 MG/ML injection (has no administration in time range)    Initial Impression / Assessment and Plan / UC Course  I have reviewed the triage vital signs and the nursing notes.  Pertinent labs & imaging results that were available during my care of the patient were reviewed by me and considered in my medical decision making (see chart for details).     X-ray with no acute bony abnormality. Spoke with Dr. Delton SeeNelson about case.  At her give second pain in a reassess patient based on appearance of shoulder. Most likely diagnosis is subacromial bursitis  No concern for septic joint, no risk factors.   Toradol injection given here in clinic prior to x-ray with some pain relief. Steroid injection given in the right shoulder Ice pack given Spoke with patient and educated about frozen  shoulder and even that he has limited movement of the shoulder he still needs to try to do small range of motion exercises Sent prescription for tramadol for severe pain to use mostly at night to rest. If his symptoms continue or do not improve he will need to be seen by specialist.   Final Clinical Impressions(s) / UC Diagnoses   Final diagnoses:  Acute pain of right shoulder     Discharge Instructions     Steroid injection given here in clinic into the shoulder. Rest, apply ice a few times a day for least 20 minutes at a time. We also gave you a Toradol injection here in clinic for pain. Tramadol sent to the pharmacy for more severe pain For any continued or worsening symptoms you need to follow-up with orthopedic specialist.    ED Prescriptions    Medication Sig Dispense Auth. Provider   traMADol (ULTRAM) 50 MG tablet Take 1 tablet (50 mg total) by mouth every 12 (twelve) hours as needed for up to 3 days. 6 tablet Loura Halt A, NP     Controlled Substance Prescriptions [place] Controlled Substance Registry consulted? Not Applicable   Orvan July, NP 05/08/19 1442    Loura Halt A, NP 05/08/19 1444

## 2020-08-15 ENCOUNTER — Emergency Department (HOSPITAL_COMMUNITY): Payer: Self-pay

## 2020-08-15 ENCOUNTER — Emergency Department (HOSPITAL_COMMUNITY)
Admission: EM | Admit: 2020-08-15 | Discharge: 2020-08-15 | Disposition: A | Discharge status: Home / Self Care | Payer: Self-pay | Attending: Emergency Medicine | Admitting: Emergency Medicine

## 2020-08-15 ENCOUNTER — Other Ambulatory Visit: Payer: Self-pay

## 2020-08-15 ENCOUNTER — Encounter (HOSPITAL_COMMUNITY): Payer: Self-pay

## 2020-08-15 DIAGNOSIS — I10 Essential (primary) hypertension: Secondary | ICD-10-CM | POA: Insufficient documentation

## 2020-08-15 DIAGNOSIS — I309 Acute pericarditis, unspecified: Secondary | ICD-10-CM | POA: Insufficient documentation

## 2020-08-15 DIAGNOSIS — E860 Dehydration: Secondary | ICD-10-CM | POA: Insufficient documentation

## 2020-08-15 DIAGNOSIS — J45909 Unspecified asthma, uncomplicated: Secondary | ICD-10-CM | POA: Insufficient documentation

## 2020-08-15 LAB — CBC WITH DIFFERENTIAL/PLATELET
Abs Immature Granulocytes: 0.02 10*3/uL (ref 0.00–0.07)
Basophils Absolute: 0 10*3/uL (ref 0.0–0.1)
Basophils Relative: 0 %
Eosinophils Absolute: 0 10*3/uL (ref 0.0–0.5)
Eosinophils Relative: 0 %
HCT: 40.6 % (ref 39.0–52.0)
Hemoglobin: 12.8 g/dL — ABNORMAL LOW (ref 13.0–17.0)
Immature Granulocytes: 0 %
Lymphocytes Relative: 38 %
Lymphs Abs: 1.9 10*3/uL (ref 0.7–4.0)
MCH: 24.2 pg — ABNORMAL LOW (ref 26.0–34.0)
MCHC: 31.5 g/dL (ref 30.0–36.0)
MCV: 76.6 fL — ABNORMAL LOW (ref 80.0–100.0)
Monocytes Absolute: 0.5 10*3/uL (ref 0.1–1.0)
Monocytes Relative: 9 %
Neutro Abs: 2.7 10*3/uL (ref 1.7–7.7)
Neutrophils Relative %: 53 %
Platelets: 160 10*3/uL (ref 150–400)
RBC: 5.3 MIL/uL (ref 4.22–5.81)
RDW: 13.1 % (ref 11.5–15.5)
WBC: 5.1 10*3/uL (ref 4.0–10.5)
nRBC: 0 % (ref 0.0–0.2)

## 2020-08-15 LAB — COMPREHENSIVE METABOLIC PANEL
ALT: 23 U/L (ref 0–44)
AST: 48 U/L — ABNORMAL HIGH (ref 15–41)
Albumin: 3.9 g/dL (ref 3.5–5.0)
Alkaline Phosphatase: 90 U/L (ref 38–126)
Anion gap: 11 (ref 5–15)
BUN: 13 mg/dL (ref 6–20)
CO2: 21 mmol/L — ABNORMAL LOW (ref 22–32)
Calcium: 8.7 mg/dL — ABNORMAL LOW (ref 8.9–10.3)
Chloride: 104 mmol/L (ref 98–111)
Creatinine, Ser: 1.07 mg/dL (ref 0.61–1.24)
GFR, Estimated: >60 mL/min (ref >60)
Glucose, Bld: 123 mg/dL — ABNORMAL HIGH (ref 70–99)
Potassium: 4.7 mmol/L (ref 3.5–5.1)
Sodium: 136 mmol/L (ref 135–145)
Total Bilirubin: 1.5 mg/dL — ABNORMAL HIGH (ref 0.3–1.2)
Total Protein: 6.9 g/dL (ref 6.5–8.1)

## 2020-08-15 LAB — TROPONIN I (HIGH SENSITIVITY)
Troponin I (High Sensitivity): 7 ng/L (ref <18)
Troponin I (High Sensitivity): 9 ng/L (ref <18)

## 2020-08-15 LAB — SEDIMENTATION RATE: Sed Rate: 12 mm/hr (ref 0–16)

## 2020-08-15 LAB — MAGNESIUM: Magnesium: 1.8 mg/dL (ref 1.7–2.4)

## 2020-08-15 LAB — C-REACTIVE PROTEIN: CRP: 1.7 mg/dL — ABNORMAL HIGH (ref <1.0)

## 2020-08-15 MED ORDER — ASPIRIN 81 MG PO CHEW
324.0000 mg | CHEWABLE_TABLET | Freq: Once | ORAL | Status: DC
  Filled 2020-08-15: qty 4

## 2020-08-15 MED ORDER — COLCHICINE 0.6 MG PO TABS
0.6000 mg | ORAL_TABLET | Freq: Once | ORAL | Status: AC
  Administered 2020-08-15: 0.6 mg via ORAL
  Filled 2020-08-15: qty 1

## 2020-08-15 MED ORDER — IBUPROFEN 200 MG PO TABS: 800.0000 mg | ORAL_TABLET | Freq: Two times a day (BID) | ORAL | Status: DC

## 2020-08-15 MED ORDER — IBUPROFEN 800 MG PO TABS
800.0000 mg | ORAL_TABLET | Freq: Once | ORAL | Status: AC
Start: 2020-08-15 — End: 2020-08-15
  Administered 2020-08-15: 800 mg via ORAL
  Filled 2020-08-15: qty 1

## 2020-08-15 MED ORDER — COLCHICINE 0.6 MG PO CAPS: 0.6000 mg | ORAL_CAPSULE | Freq: Two times a day (BID) | ORAL | 0 refills | Status: DC

## 2020-08-15 MED ORDER — SODIUM CHLORIDE 0.9 % IV BOLUS
500.0000 mL | Freq: Once | INTRAVENOUS | Status: AC
  Administered 2020-08-15: 500 mL via INTRAVENOUS

## 2020-08-15 NOTE — Discharge Instructions (Addendum)
It appears that colchicine is a little more expensive. You can get the whole prescription for 125.00-130.00 with goodrx coupon at [facility]. If this is cost prohibitive, the other option would be to take 800mg  Ibuprofen for 3 months instead.  If you take the colchicine then you should taper your ibuprofen:  800mg  twice daily for two weeks, then 400mg  twice daily for two weeks, then 200mg  twice daily for two weeks, then 400 as needed twice a day.  Return to the emergency department with any change or worsening in symptoms.  If persistent chest pain longer than 7-10 days, follow up with cardiology.
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
## 2020-08-15 NOTE — ED Triage Notes (Signed)
Patient brought in by ems with reports of viral like illness for a few days. Patient was cooking when bystander called ems for near syncopal episode. Upon ems arrival, patient hypotensive. normal saline bolus administered. Patient alert and oriented upon arrival.

## 2021-01-30 ENCOUNTER — Other Ambulatory Visit: Payer: Self-pay

## 2021-01-30 ENCOUNTER — Ambulatory Visit (HOSPITAL_COMMUNITY)
Admission: EM | Admit: 2021-01-30 | Discharge: 2021-01-30 | Disposition: A | Discharge status: Home / Self Care | Payer: Self-pay | Attending: Sports Medicine | Admitting: Sports Medicine

## 2021-01-30 ENCOUNTER — Encounter (HOSPITAL_COMMUNITY): Payer: Self-pay | Admitting: Emergency Medicine

## 2021-01-30 ENCOUNTER — Ambulatory Visit (INDEPENDENT_AMBULATORY_CARE_PROVIDER_SITE_OTHER): Payer: Self-pay

## 2021-01-30 DIAGNOSIS — M25651 Stiffness of right hip, not elsewhere classified: Secondary | ICD-10-CM

## 2021-01-30 DIAGNOSIS — M62838 Other muscle spasm: Secondary | ICD-10-CM

## 2021-01-30 DIAGNOSIS — M25551 Pain in right hip: Secondary | ICD-10-CM

## 2021-01-30 DIAGNOSIS — M25552 Pain in left hip: Secondary | ICD-10-CM

## 2021-01-30 MED ORDER — NAPROXEN 500 MG PO TABS: 500.0000 mg | ORAL_TABLET | Freq: Two times a day (BID) | ORAL | 0 refills | Status: DC

## 2021-01-30 MED ORDER — CYCLOBENZAPRINE HCL 10 MG PO TABS: 10.0000 mg | ORAL_TABLET | Freq: Two times a day (BID) | ORAL | 0 refills | Status: DC | PRN
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
## 2021-01-30 NOTE — Discharge Instructions (Addendum)
Your x-rays do not show any acute fracture or any new findings.  It does show chronic changes. I recommend that you follow-up with orthopedics.  I have given you the number to call. I also sent in a medicine for inflammation as well as muscle spasm. I also put in a referral to help you obtain a primary care physician which you definitely need. Please see educational handouts. If symptoms worsen please go to the ER.

## 2021-06-04 ENCOUNTER — Emergency Department (HOSPITAL_COMMUNITY): Payer: Self-pay

## 2021-06-04 ENCOUNTER — Emergency Department (HOSPITAL_COMMUNITY)
Admission: EM | Admit: 2021-06-04 | Discharge: 2021-06-04 | Disposition: A | Discharge status: Home / Self Care | Payer: Self-pay | Attending: Emergency Medicine | Admitting: Emergency Medicine

## 2021-06-04 DIAGNOSIS — Z23 Encounter for immunization: Secondary | ICD-10-CM | POA: Insufficient documentation

## 2021-06-04 DIAGNOSIS — Z20822 Contact with and (suspected) exposure to covid-19: Secondary | ICD-10-CM | POA: Insufficient documentation

## 2021-06-04 DIAGNOSIS — Y249XXA Unspecified firearm discharge, undetermined intent, initial encounter: Secondary | ICD-10-CM

## 2021-06-04 DIAGNOSIS — W3400XA Accidental discharge from unspecified firearms or gun, initial encounter: Secondary | ICD-10-CM

## 2021-06-04 DIAGNOSIS — J45909 Unspecified asthma, uncomplicated: Secondary | ICD-10-CM | POA: Insufficient documentation

## 2021-06-04 DIAGNOSIS — I10 Essential (primary) hypertension: Secondary | ICD-10-CM | POA: Insufficient documentation

## 2021-06-04 DIAGNOSIS — S81802A Unspecified open wound, left lower leg, initial encounter: Secondary | ICD-10-CM | POA: Insufficient documentation

## 2021-06-04 LAB — URINALYSIS, ROUTINE W REFLEX MICROSCOPIC
Bacteria, UA: NONE SEEN
Bilirubin Urine: NEGATIVE
Glucose, UA: NEGATIVE mg/dL
Hgb urine dipstick: NEGATIVE
Ketones, ur: NEGATIVE mg/dL
Leukocytes,Ua: NEGATIVE
Nitrite: NEGATIVE
Protein, ur: 30 mg/dL — AB
Specific Gravity, Urine: 1.018 (ref 1.005–1.030)
pH: 5 (ref 5.0–8.0)

## 2021-06-04 LAB — COMPREHENSIVE METABOLIC PANEL
ALT: 21 U/L (ref 0–44)
AST: 23 U/L (ref 15–41)
Albumin: 4.3 g/dL (ref 3.5–5.0)
Alkaline Phosphatase: 111 U/L (ref 38–126)
Anion gap: 10 (ref 5–15)
BUN: 12 mg/dL (ref 6–20)
CO2: 22 mmol/L (ref 22–32)
Calcium: 9.8 mg/dL (ref 8.9–10.3)
Chloride: 107 mmol/L (ref 98–111)
Creatinine, Ser: 1.07 mg/dL (ref 0.61–1.24)
GFR, Estimated: >60 mL/min (ref >60)
Glucose, Bld: 135 mg/dL — ABNORMAL HIGH (ref 70–99)
Potassium: 3.7 mmol/L (ref 3.5–5.1)
Sodium: 139 mmol/L (ref 135–145)
Total Bilirubin: 0.5 mg/dL (ref 0.3–1.2)
Total Protein: 7.1 g/dL (ref 6.5–8.1)

## 2021-06-04 LAB — RESP PANEL BY RT-PCR (FLU A&B, COVID) ARPGX2
Influenza A by PCR: NEGATIVE
Influenza B by PCR: NEGATIVE
SARS Coronavirus 2 by RT PCR: NEGATIVE

## 2021-06-04 LAB — I-STAT CHEM 8, ED
BUN: 13 mg/dL (ref 6–20)
Calcium, Ion: 1.24 mmol/L (ref 1.15–1.40)
Chloride: 106 mmol/L (ref 98–111)
Creatinine, Ser: 1 mg/dL (ref 0.61–1.24)
Glucose, Bld: 131 mg/dL — ABNORMAL HIGH (ref 70–99)
HCT: 39 % (ref 39.0–52.0)
Hemoglobin: 13.3 g/dL (ref 13.0–17.0)
Potassium: 3.7 mmol/L (ref 3.5–5.1)
Sodium: 142 mmol/L (ref 135–145)
TCO2: 24 mmol/L (ref 22–32)

## 2021-06-04 LAB — SAMPLE TO BLOOD BANK

## 2021-06-04 LAB — CBC
HCT: 38.6 % — ABNORMAL LOW (ref 39.0–52.0)
Hemoglobin: 12.4 g/dL — ABNORMAL LOW (ref 13.0–17.0)
MCH: 24.8 pg — ABNORMAL LOW (ref 26.0–34.0)
MCHC: 32.1 g/dL (ref 30.0–36.0)
MCV: 77 fL — ABNORMAL LOW (ref 80.0–100.0)
Platelets: 248 10*3/uL (ref 150–400)
RBC: 5.01 MIL/uL (ref 4.22–5.81)
RDW: 13.2 % (ref 11.5–15.5)
WBC: 6.9 10*3/uL (ref 4.0–10.5)
nRBC: 0 % (ref 0.0–0.2)

## 2021-06-04 LAB — PROTIME-INR
INR: 1 (ref 0.8–1.2)
Prothrombin Time: 13.2 seconds (ref 11.4–15.2)

## 2021-06-04 LAB — LACTIC ACID, PLASMA: Lactic Acid, Venous: 2.3 mmol/L (ref 0.5–1.9)

## 2021-06-04 MED ORDER — FENTANYL CITRATE PF 50 MCG/ML IJ SOSY
100.0000 ug | PREFILLED_SYRINGE | Freq: Once | INTRAMUSCULAR | Status: AC
  Administered 2021-06-04: 100 ug via INTRAVENOUS
  Filled 2021-06-04: qty 2

## 2021-06-04 MED ORDER — SODIUM CHLORIDE 0.9 % IV SOLN
100.0000 mg | Freq: Once | INTRAVENOUS | Status: AC
  Administered 2021-06-04: 100 mg via INTRAVENOUS
  Filled 2021-06-04: qty 100

## 2021-06-04 MED ORDER — IOHEXOL 350 MG/ML SOLN
100.0000 mL | Freq: Once | INTRAVENOUS | Status: AC | PRN
  Administered 2021-06-04: 100 mL via INTRAVENOUS

## 2021-06-04 MED ORDER — TETANUS-DIPHTH-ACELL PERTUSSIS 5-2.5-18.5 LF-MCG/0.5 IM SUSY
0.5000 mL | PREFILLED_SYRINGE | Freq: Once | INTRAMUSCULAR | Status: AC
  Administered 2021-06-04: 0.5 mL via INTRAMUSCULAR
  Filled 2021-06-04: qty 0.5

## 2021-06-04 MED ORDER — HYDROCODONE-ACETAMINOPHEN 5-325 MG PO TABS
1.0000 | ORAL_TABLET | Freq: Once | ORAL | Status: AC
  Administered 2021-06-04: 1 via ORAL
  Filled 2021-06-04: qty 1

## 2021-06-04 MED ORDER — HYDROCODONE-ACETAMINOPHEN 5-325 MG PO TABS: 1.0000 | ORAL_TABLET | Freq: Four times a day (QID) | ORAL | 0 refills | Status: DC | PRN

## 2021-06-04 MED ORDER — SODIUM CHLORIDE 0.9 % IV BOLUS
500.0000 mL | Freq: Once | INTRAVENOUS | Status: AC
  Administered 2021-06-04: 500 mL via INTRAVENOUS
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
## 2021-06-04 NOTE — Discharge Instructions (Signed)
Return for any problem.  ?

## 2021-06-04 NOTE — ED Provider Notes (Signed)
Seen after prior ED provider.  Patient CT angio is without evidence of vascular injury.  Patient's wound is dressed.  Patient understands need for close outpatient follow-up.  Importance of close follow-up is repeatedly stressed.  Strict return precautions given and understood.    Wynetta Fines, MD 06/04/21 [phone]
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
## 2021-06-04 NOTE — ED Notes (Signed)
Delay in CT due to labs

## 2021-12-04 IMAGING — DX DG HIP (WITH OR WITHOUT PELVIS) 2-3V*R*
2 series · 2 of 2 positions shown · non-contrast
Comparison: 01/31/2015

CLINICAL DATA: Hip pain

EXAM:
DG HIP (WITH OR WITHOUT PELVIS) 2-3V RIGHT

[hip ap]
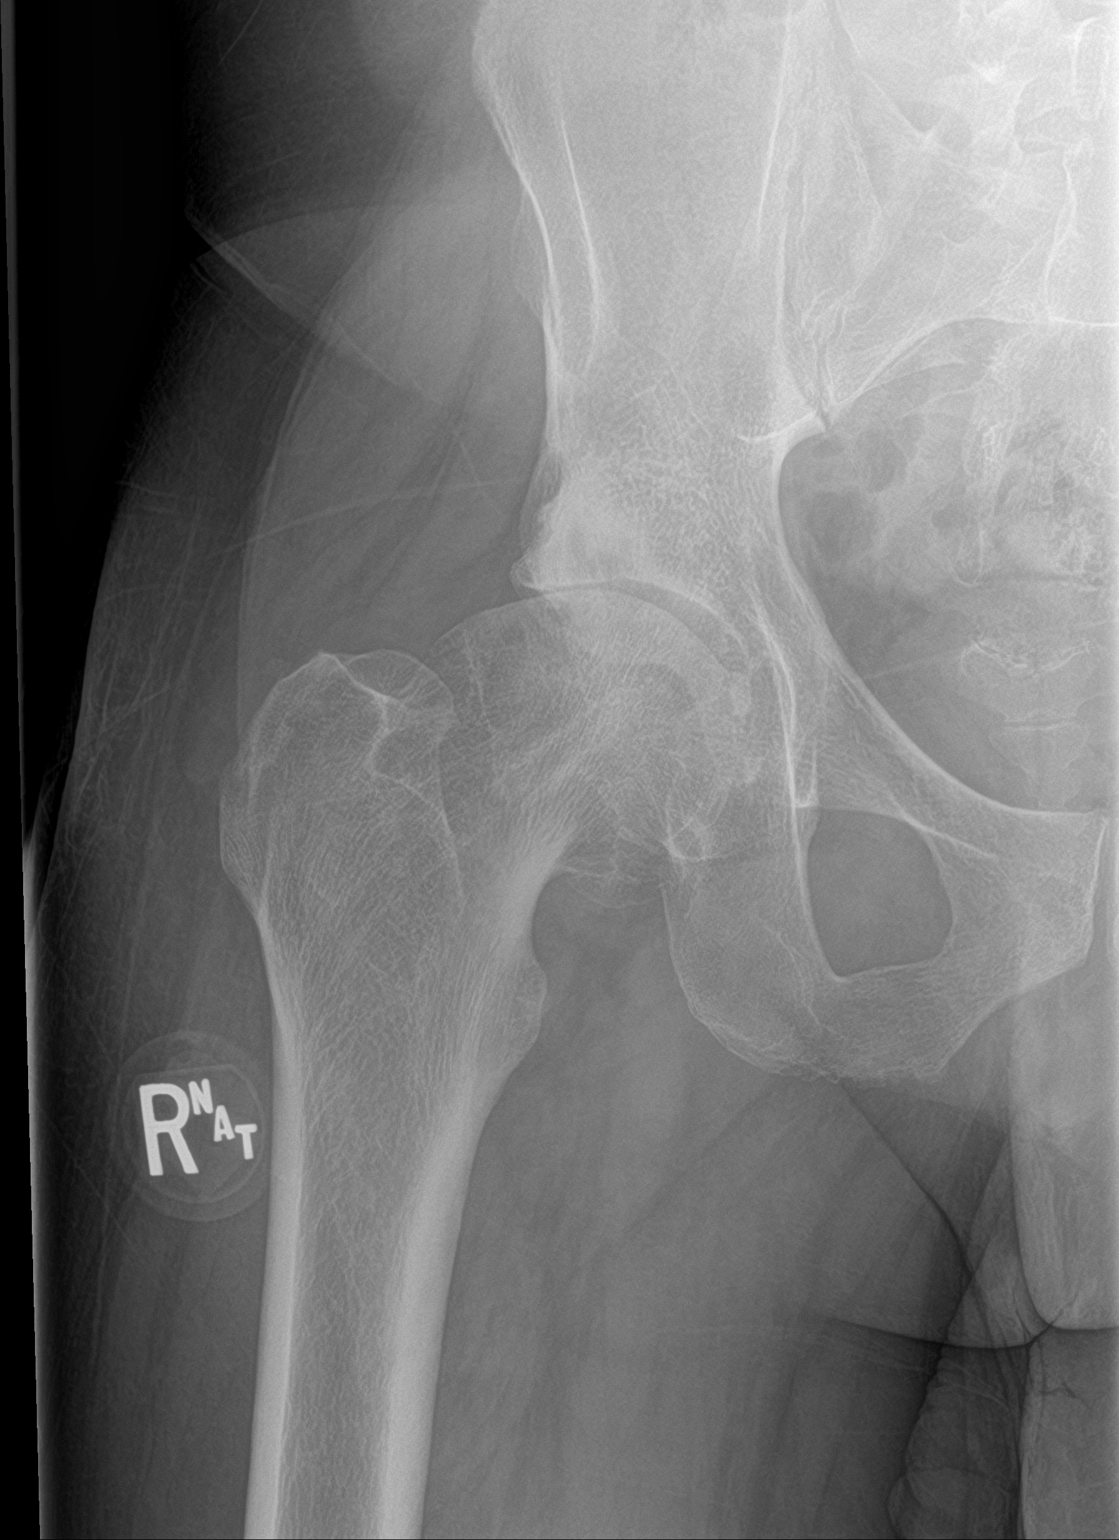

[hip lat]
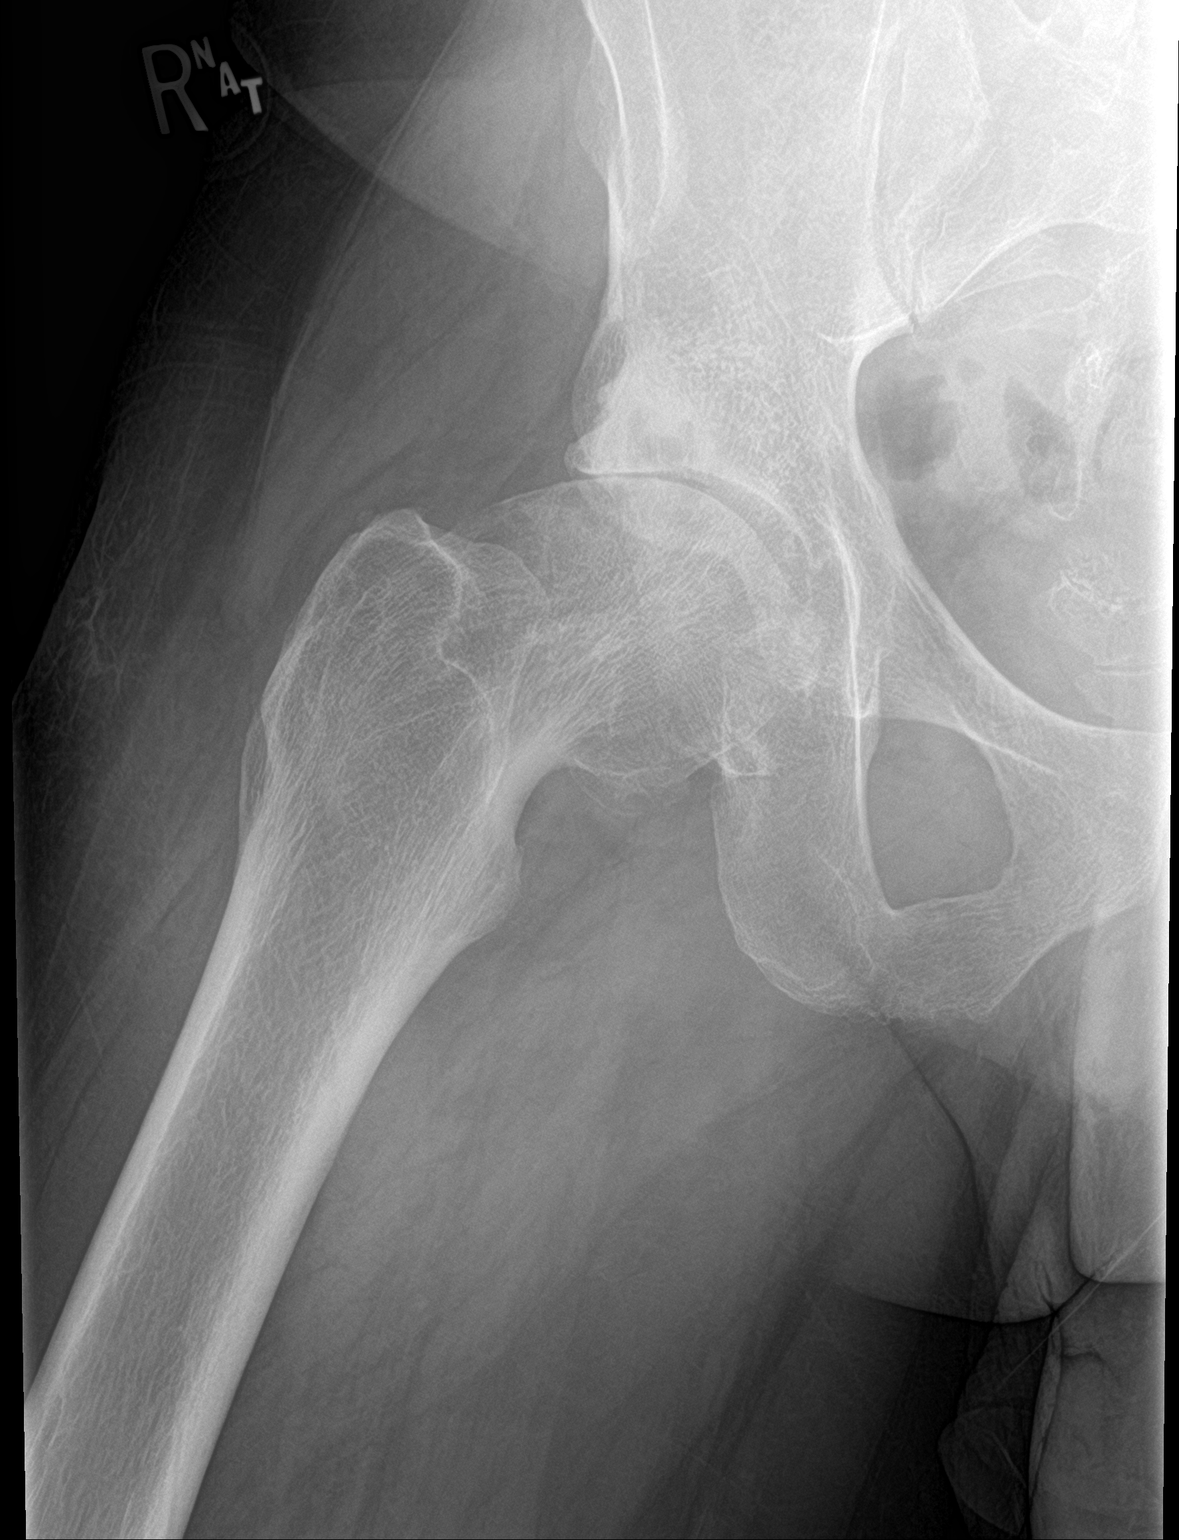

[2 of 2 positions shown; findings below may reference images not displayed]

FINDINGS: Flattening of the right femoral head with deformity of the femoral
head and neck. Associated joint space narrowing and spurring.
Subchondral cyst formation and sclerosis. No acute bony abnormality.
Specifically, no fracture, subluxation, or dislocation.
IMPRESSION: No acute bony abnormality.

## 2022-04-12 ENCOUNTER — Ambulatory Visit
Admission: EM | Admit: 2022-04-12 | Discharge: 2022-04-12 | Disposition: A | Discharge status: Home / Self Care | Payer: Self-pay | Attending: Internal Medicine | Admitting: Internal Medicine

## 2022-04-12 DIAGNOSIS — M25531 Pain in right wrist: Secondary | ICD-10-CM

## 2022-04-12 DIAGNOSIS — M79641 Pain in right hand: Secondary | ICD-10-CM

## 2022-04-12 DIAGNOSIS — R030 Elevated blood-pressure reading, without diagnosis of hypertension: Secondary | ICD-10-CM

## 2022-04-12 MED ORDER — PREDNISONE 20 MG PO TABS: 20.0000 mg | ORAL_TABLET | Freq: Every day | ORAL | 0 refills | Status: AC

## 2022-04-12 NOTE — ED Triage Notes (Signed)
Pt c/o right hand numbness in first and third digit. States it is radiating upward on arm. This has been happening x 1 month.

## 2022-04-12 NOTE — Discharge Instructions (Signed)
It appears that you have carpal tunnel syndrome. Please see attached education regarding this. I have prescribed you prednisone to decrease inflammation associated with this. Your blood pressure was also elevated today. Please obtain a BP cuff from the pharmacy and monitor very closely. Please follow up if it remains elevated.

## 2022-04-12 NOTE — ED Provider Notes (Signed)
[facility]    CSN: [id number] Arrival date & time: 04/12/22  0815      History   Chief Complaint Chief Complaint  Patient presents with   RUE numbness    HPI Justin Walls is a 46 y.o. male.   Patient presents with right hand and wrist pain with numbness and tingling that radiates up through wrist and through first through third digits.  Denies any apparent injury.  Patient reports that symptoms have been present for about a month.  He has taken ibuprofen intermittently with minimal improvement.  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  He also has elevated blood pressure reading with retake being similar.  He denies any previous history of high blood pressure and does not take any blood pressure medications.  Denies any associated chest pain, shortness of breath, headache, dizziness, blurred vision, nausea, vomiting.     Past Medical History:  Diagnosis Date   Asthma    Hypertension     There are no problems to display for this patient.   Past Surgical History:  Procedure Laterality Date   HIP SURGERY     hip sx         Home Medications    Prior to Admission medications   Medication Sig Start Date End Date Taking? Authorizing Provider  predniSONE (DELTASONE) 20 MG tablet Take 1 tablet (20 mg total) by mouth daily for 5 days. 04/12/22 04/17/22 Yes Gustavus Bryant, FNP  Colchicine 0.6 MG CAPS Take 0.6 mg by mouth in the morning and at bedtime. Patient not taking: Reported on 01/30/2021 08/15/20 11/13/20  Mesner, Barbara Cower, MD  cyclobenzaprine (FLEXERIL) 10 MG tablet Take 1 tablet (10 mg total) by mouth 2 (two) times daily as needed for muscle spasms. 01/30/21   Delton See, MD  docusate sodium (COLACE) 100 MG capsule Take 1 capsule (100 mg total) by mouth daily as needed for mild constipation. Patient not taking: Reported on 01/30/2021 01/31/15   Purvis Sheffield, MD  HYDROcodone-acetaminophen  (NORCO/VICODIN) 5-325 MG tablet Take 1 tablet by mouth every 6 (six) hours as needed. 06/04/21   Wynetta Fines, MD  naproxen (NAPROSYN) 500 MG tablet Take 1 tablet (500 mg total) by mouth 2 (two) times daily. 01/30/21   Delton See, MD  albuterol (PROVENTIL HFA;VENTOLIN HFA) 108 (90 BASE) MCG/ACT inhaler Inhale 1-2 puffs into the lungs every 6 (six) hours as needed for wheezing. 10/03/12 05/08/19  Reuben Likes, MD    Family History Family History  Problem Relation Age of Onset   Diabetes Mother    Hypertension Mother    Healthy Father     Social History Social History   Tobacco Use   Smoking status: Never   Smokeless tobacco: Never  Vaping Use   Vaping Use: Never used  Substance Use Topics   Alcohol use: Yes   Drug use: Yes    Types: Marijuana    Comment: twice a month     Allergies   Amoxicillin and Amoxicillin   Review of Systems Review of Systems Per HPI  Physical Exam Triage Vital Signs ED Triage Vitals  Enc Vitals Group     BP 04/12/22 0834 (!) 174/102     Pulse Rate 04/12/22 0834 80     Resp 04/12/22 0834 18     Temp 04/12/22 0834 98.5 F (36.9 C)     Temp Source 04/12/22 0834 Oral  SpO2 04/12/22 0834 98 %     Weight --      Height --      Head Circumference --      Peak Flow --      Pain Score 04/12/22 0835 0     Pain Loc --      Pain Edu? --      Excl. in [place]? --    No data found.  Updated Vital Signs BP (!) 165/110   Pulse 80   Temp 98.5 F (36.9 C) (Oral)   Resp 18   SpO2 98%   Visual Acuity Right Eye Distance:   Left Eye Distance:   Bilateral Distance:    Right Eye Near:   Left Eye Near:    Bilateral Near:     Physical Exam Constitutional:      General: He is not in acute distress.    Appearance: Normal appearance. He is not toxic-appearing or diaphoretic.  HENT:     Head: Normocephalic and atraumatic.  Eyes:     Extraocular Movements: Extraocular movements intact.     Conjunctiva/sclera: Conjunctivae normal.   Cardiovascular:     Rate and Rhythm: Normal rate and regular rhythm.     Pulses: Normal pulses.     Heart sounds: Normal heart sounds.  Pulmonary:     Effort: Pulmonary effort is normal. No respiratory distress.     Breath sounds: Normal breath sounds.  Musculoskeletal:     Comments: No tenderness to palpation.  Positive Tinel's and Phalen's sign.  Grip strength 5/5.  Capillary refill and pulses normal.  No obvious swelling, discoloration, warmth, lacerations, abrasions noted.  Neurological:     General: No focal deficit present.     Mental Status: He is alert and oriented to person, place, and time. Mental status is at baseline.     Cranial Nerves: Cranial nerves 2-12 are intact.     Sensory: Sensation is intact.     Motor: Motor function is intact.     Coordination: Coordination is intact.     Gait: Gait is intact.  Psychiatric:        Mood and Affect: Mood normal.        Behavior: Behavior normal.        Thought Content: Thought content normal.        Judgment: Judgment normal.      UC Treatments / Results  Labs (all labs ordered are listed, but only abnormal results are displayed) Labs Reviewed - No data to display  EKG   Radiology No results found.  Procedures Procedures (including critical care time)  Medications Ordered in UC Medications - No data to display  Initial Impression / Assessment and Plan / UC Course  I have reviewed the triage vital signs and the nursing notes.  Pertinent labs & imaging results that were available during my care of the patient were reviewed by me and considered in my medical decision making (see chart for details).     Patient's physical exam of right upper extremity is consistent with carpal tunnel syndrome.  Wrist brace applied in urgent care and patient encouraged to sleep in this.  Will avoid ibuprofen given patient's elevated blood pressure reading today.  Will treat with prednisone given no other obvious contraindications  to prednisone noted on exam.  Will treat with short course and low-dose of prednisone.  Discussed supportive care as well.  Advised patient to follow-up if symptoms persist or worsen.  Patient has elevated blood  pressure reading with retake being similar.  No signs of hypertensive urgency or endorgan damage on exam.  Neuro exam is also normal.  Suspect that patient's pain could be from pain/discomfort.  Patient advised to get a home blood pressure cuff and to monitor very closely at home.  He was advised to follow-up if blood pressure remains elevated.  He was also advised to go the ER if chest pain, headache, dizziness, shortness of breath, blurry vision, nausea, vomiting develop. Final Clinical Impressions(s) / UC Diagnoses   Final diagnoses:  Right hand pain  Right wrist pain  Elevated blood pressure reading     Discharge Instructions      It appears that you have carpal tunnel syndrome. Please see attached education regarding this. I have prescribed you prednisone to decrease inflammation associated with this. Your blood pressure was also elevated today. Please obtain a BP cuff from the pharmacy and monitor very closely. Please follow up if it remains elevated.     ED Prescriptions     Medication Sig Dispense Auth. Provider   predniSONE (DELTASONE) 20 MG tablet Take 1 tablet (20 mg total) by mouth daily for 5 days. 5 tablet [place], Acie Fredrickson, [place]      PDMP not reviewed this encounter.   Gustavus Bryant, [place] 04/12/22 [phone]

## 2022-09-21 ENCOUNTER — Ambulatory Visit (INDEPENDENT_AMBULATORY_CARE_PROVIDER_SITE_OTHER): Payer: Commercial Managed Care - HMO | Admitting: Student

## 2022-09-21 ENCOUNTER — Encounter: Payer: Self-pay | Admitting: Student

## 2022-09-21 VITALS — BP 173/115 | HR 69 | Wt 217.2 lb

## 2022-09-21 DIAGNOSIS — Z1159 Encounter for screening for other viral diseases: Secondary | ICD-10-CM | POA: Diagnosis not present

## 2022-09-21 DIAGNOSIS — Z1211 Encounter for screening for malignant neoplasm of colon: Secondary | ICD-10-CM

## 2022-09-21 DIAGNOSIS — Z114 Encounter for screening for human immunodeficiency virus [HIV]: Secondary | ICD-10-CM

## 2022-09-21 DIAGNOSIS — I10 Essential (primary) hypertension: Secondary | ICD-10-CM | POA: Insufficient documentation

## 2022-09-21 DIAGNOSIS — Z13220 Encounter for screening for lipoid disorders: Secondary | ICD-10-CM

## 2022-09-21 DIAGNOSIS — Z131 Encounter for screening for diabetes mellitus: Secondary | ICD-10-CM

## 2022-09-21 DIAGNOSIS — M160 Bilateral primary osteoarthritis of hip: Secondary | ICD-10-CM

## 2022-09-21 LAB — POCT GLYCOSYLATED HEMOGLOBIN (HGB A1C): Hemoglobin A1C: 5.7 % — AB (ref 4.0–5.6)

## 2022-09-21 MED ORDER — LOSARTAN POTASSIUM-HCTZ 50-12.5 MG PO TABS: 1.0000 | ORAL_TABLET | Freq: Every day | ORAL | 1 refills | Status: DC

## 2022-09-21 NOTE — Progress Notes (Signed)
    SUBJECTIVE:   CHIEF COMPLAINT / HPI:   Establish PCP Has not had regularly primary care since 47 y.o. Would like to discuss his high blood pressure and arthritis today.  HTN Made aware of hypertension in August 2023 after visiting urgent care for right hand injury.  At that time blood pressure was 174/102, no medications were started.  Patient does not take medications for blood pressure.  Today blood pressure 173/115 on recheck.  No red flag symptoms today.  Patient is agreeable to starting medications today.  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  Most recent hip films are May 2022, and showed flattening deformity of bilateral femoral heads and secondary arthritis.  Patient reports worsening pain in hips.  He is on his feet most days of the week for his job.  PERTINENT  PMH / PSH:  -Elevated blood pressure reading -Bilateral hip surgery in 1992/9094 for slipped epiphysis  Family hx: Mom: Diabetes, HTN  Medications Meds: None Supp: Takes garlic pills  Social: Drug: Smokes marijuana, daily.  Alcohol use: Occasional. Last drink 6 months ago Smoking: Quit smoking 15 years, 20 years (1 pack a week). Sexually active: 1 partner, wife. No STD history. Has 3 brothers 3 sisters (3rd child)  OBJECTIVE:   BP (!) 173/115   Pulse 69   Wt 217 lb 4 oz (98.5 kg)   SpO2 96%   BMI 28.66 kg/m    General: NAD, pleasant HEENT: Normocephalic, atraumatic head.  EOM intact bilaterally, normal conjunctiva.  Normal external nose.  Normal external ear, canal, TM bilaterally.  Throat is moist, no exudate no erythema.  No cervical adenopathy. Cardio: RRR, no MRG.  Cap refill <2s.  No peripheral edema. Respiratory: CTAB, normal currently on room air MSK: Normal gait, 5/5 lower extremity strength. Normal sensation.  ASSESSMENT/PLAN:   Primary hypertension 2 elevated blood pressure readings in 2 separate settings.  No red flag symptoms  today.  Blood pressure 173/115 today, meets criteria to start 2 blood pressure medications.  Patient is agreeable to starting medication.  Additionally counseled on lifestyle modifications. 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  Reports worsening pain, I would like to see orthopedics to discuss options.  Patient reports being previously seen by Dr. French Ana. Not currently taking medications for pain. - Recommend OTC acetaminophen/ibuprofen - Referral sent to orthopedics   Healthcare Maintenance - Need for colon cancer screening. Patient agreeable to colonoscopy.  Referral sent. - Need for HIV screening, patient agreeable. Ordered. - Need for hep C screening, patient agreeable. Ordered. - Need for diabetes screening, patient agreeable.  Ordered. - Need for lipid screening, patient agreeable.  Ordered.  Leslie Dales, [place]

## 2022-09-21 NOTE — Assessment & Plan Note (Signed)
2 elevated blood pressure readings in 2 separate settings.  No red flag symptoms today.  Blood pressure 173/115 today, meets criteria to start 2 blood pressure medications.  Patient is agreeable to starting medication.  Additionally counseled on lifestyle modifications. - Hyzaar 50-12.5 mg daily - Lifestyle modifications - BMP - Follow-up in 2 to 4 weeks to discuss blood pressure

## 2022-09-21 NOTE — Patient Instructions (Addendum)
It was great to see you! Thank you for allowing me to participate in your care!   I recommend that you always bring your medications to each appointment as this makes it easy to ensure we are on the correct medications and helps [place] not miss when refills are needed.  Our plans for today:  - Please take 1 tablet of losartan-hydrochlorothiazide (Hyzaar) every day for blood pressure - Follow-up in the next 2-4 weeks to discuss blood pressure and other medical conditions - I have placed a referral to orthopedic surgery to discuss hip arthritis - I have placed a referral to gastroenterology for colonoscopy, they will call you with appointment  We are checking some labs today, I will call you if they are abnormal will send you a MyChart message or a letter if they are normal.  If you do not hear about your labs in the next 2 weeks please let us know.  Take care and seek immediate care sooner if you develop any concerns. Please remember to show up 15 minutes before your scheduled appointment time!  Leslie Dales, DO [facility]

## 2022-09-21 NOTE — Assessment & Plan Note (Addendum)
History of slipped femoral epiphysis bilaterally.  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  Reports worsening pain, I would like to see orthopedics to discuss options.  Patient reports being previously seen by Dr. French Ana. Not currently taking medications for pain. - Recommend OTC acetaminophen/ibuprofen - Referral sent to orthopedics

## 2022-09-22 ENCOUNTER — Telehealth: Payer: Self-pay | Admitting: Student

## 2022-09-22 LAB — BASIC METABOLIC PANEL
BUN/Creatinine Ratio: 12 (ref 9–20)
BUN: 11 mg/dL (ref 6–24)
CO2: 19 mmol/L — ABNORMAL LOW (ref 20–29)
Calcium: 9.9 mg/dL (ref 8.7–10.2)
Chloride: 103 mmol/L (ref 96–106)
Creatinine, Ser: 0.89 mg/dL (ref 0.76–1.27)
Glucose: 102 mg/dL — ABNORMAL HIGH (ref 70–99)
Potassium: 4.3 mmol/L (ref 3.5–5.2)
Sodium: 140 mmol/L (ref 134–144)
eGFR: 108 mL/min/{1.73_m2} (ref >59)

## 2022-09-22 LAB — LIPID PANEL
Chol/HDL Ratio: 3.2 ratio (ref 0.0–5.0)
Cholesterol, Total: 174 mg/dL (ref 100–199)
HDL: 54 mg/dL (ref >39)
LDL Chol Calc (NIH): 101 mg/dL — ABNORMAL HIGH (ref 0–99)
Triglycerides: 106 mg/dL (ref 0–149)
VLDL Cholesterol Cal: 19 mg/dL (ref 5–40)

## 2022-09-22 LAB — HIV ANTIBODY (ROUTINE TESTING W REFLEX): HIV Screen 4th Generation wRfx: NONREACTIVE

## 2022-09-22 LAB — HEPATITIS C ANTIBODY: Hep C Virus Ab: NONREACTIVE

## 2022-09-22 NOTE — Telephone Encounter (Signed)
Spoke with patient on the phone, and confirmed identity.  Discussed normal laboratory work, in addition to A1c of 5.7 and very mildly elevated LDL cholesterol.  Informed patient that we would discuss these lab results further at his next appointment later this month. Patient expressed understanding and agreement with plan.

## 2022-10-11 ENCOUNTER — Encounter: Payer: Self-pay | Admitting: Student

## 2022-10-11 ENCOUNTER — Ambulatory Visit (INDEPENDENT_AMBULATORY_CARE_PROVIDER_SITE_OTHER): Payer: Commercial Managed Care - HMO | Admitting: Student

## 2022-10-11 VITALS — BP 134/72 | HR 62 | Ht 73.0 in | Wt 213.0 lb

## 2022-10-11 DIAGNOSIS — R29818 Other symptoms and signs involving the nervous system: Secondary | ICD-10-CM | POA: Diagnosis not present

## 2022-10-11 NOTE — Patient Instructions (Addendum)
It was great to see you! Thank you for allowing me to participate in your care!   I recommend that you always bring your medications to each appointment as this makes it easy to ensure we are on the correct medications and helps [place] not miss when refills are needed.  Our plans for today:  -I have sent referral for sleep study, they will call you to make appointment -We will fax papers to orthopedic surgeon -Continue to take Hyzaar every day -Follow-up as needed   Take care and seek immediate care sooner if you develop any concerns. Please remember to show up 15 minutes before your scheduled appointment time!  Leslie Dales, DO [facility]

## 2022-10-11 NOTE — Progress Notes (Signed)
Surgical Pre-operative Evaluation:   Procedure: Left total hip replacement Procedural risk: Intermediate risk  Anesthesia: Spinal anticipated  PMH: HTN Prediabetes Bilateral hip arthritis  Surgical History:  The patient reports difficulty breathing after extubation on previous surgery, however denies further complications including fever, NV, confusion, bleeding.  Family History: The patient denies any family history of complications with anesthesia and VTE.  Social History: Tobacco Use: Non-smoker Alcohol Use: Rarely Other substance use: Denies  Screening for OSA: STOP BANG score of 4, intermediate risk of OSA  Medication Adjustments: - Hold Hyzaar, and diuretics on day of surgery - Not on diabetic medication or blood thinners.  Risk Stratification Tool: Lyndel Safe Calculator: 0.1 % risk of MI or cardiac event, intraoperatively or up to 30 days post-op  Functional Capacity:  Climb 1 flight of stairs = 5.50 METS   If patients can achieve > 4 METS- no stress test recommended (if they cannot, consider a stress test as this can change management)  Labs to Check: - Renal function- normal 09/2022 - A1c- 5.7. Diet controlled pre-diabetes.  Vitals today: BP 134/72   Pulse 62   Ht 6\' 1"  (1.854 m)   Wt 213 lb (96.6 kg)   SpO2 100%   BMI 28.10 kg/m   Exam: General: NAD, well-appearing Cardio: RRR, no MRG. Cap refill <2s Resp: CTAB, normal wob on RA Neuro: A&Ox4  Final Assessment: Optimized cardiac, not optimized medical-see plan below.  I recommend the following additional tests: Polysomnogram. I recommend the following changes to medications in the perioperative setting: Hold Hyzaar day of. Gupta score 0.1% Risk of MI or Cardiac Event, METS greater than 4, low risk for cardiac events.  Blood pressure well-controlled, A1c 5.7 09/2022. With suspected and untreated sleep apnea, patient is not yet medically optimized from PCP point of view.  However, will defer to  anesthesiology's expertise whether or not to proceed with surgery prior to sleep study-as spinal is anticipated. Sleep study has been ordered and we will follow-up with patient as appropriate.

## 2022-10-26 ENCOUNTER — Institutional Professional Consult (permissible substitution): Payer: Commercial Managed Care - HMO | Admitting: Primary Care

## 2022-11-01 ENCOUNTER — Ambulatory Visit (INDEPENDENT_AMBULATORY_CARE_PROVIDER_SITE_OTHER): Payer: Commercial Managed Care - HMO | Admitting: Primary Care

## 2022-11-01 ENCOUNTER — Encounter: Payer: Self-pay | Admitting: Primary Care

## 2022-11-01 VITALS — BP 134/84 | HR 67 | Ht 74.0 in | Wt 221.6 lb

## 2022-11-01 DIAGNOSIS — R0683 Snoring: Secondary | ICD-10-CM

## 2022-11-01 NOTE — Progress Notes (Signed)
$'@Patient'i$  ID: Justin Walls, male    DOB: Apr 16, 1976, 47 y.o.   MRN: [id number]  Chief Complaint  Patient presents with   Consult    OSA Epworth 6    Referring provider: Kinnie Feil, MD  HPI: 47 year old male. Former smoker.  Past medical history significant for hypertension.   11/01/2022 Patient presents today for sleep consult. He has symptoms of loud snoring. Reports that he stops breathing at night.  Typical bedtime is between 9 and 11 PM.  It takes him on average 10 to 20 minutes to fall asleep.  He wakes up several times a night.  He starts his day at 6 AM.  His weight is down 20 pounds.  Previous sleep studies.  Denies symptoms of narcolepsy, cataplexy or sleepwalking.  Epworth score is 6.   Allergies  Allergen Reactions   Amoxicillin Anaphylaxis   Amoxicillin Rash    Immunization History  Administered Date(s) Administered   Tdap 06/04/2021    Past Medical History:  Diagnosis Date   Asthma    Hypertension     Tobacco History: Social History   Tobacco Use  Smoking Status Former   Packs/day: 0.25   Years: 20.00   Total pack years: 5.00   Types: Cigarettes   Quit date: 2009   Years since quitting: 15.1  Smokeless Tobacco Never   Counseling given: Not Answered   Outpatient Medications Prior to Visit  Medication Sig Dispense Refill   losartan-hydrochlorothiazide (HYZAAR) 50-12.5 MG tablet Take 1 tablet by mouth daily. 30 tablet 1   No facility-administered medications prior to visit.    Review of Systems  Review of Systems  Constitutional:  Positive for fatigue.  HENT: Negative.    Respiratory:  Positive for apnea.   Cardiovascular: Negative.   Psychiatric/Behavioral:  Positive for sleep disturbance.     Physical Exam  BP 134/84 (BP Location: Left Arm, Patient Position: Sitting, Cuff Size: Large)   Pulse 67   Ht '6\' 2"'$  (1.88 m)   Wt 221 lb 9.6 oz (100.5 kg)   SpO2 97%   BMI 28.45 kg/m  Physical Exam Constitutional:      Appearance:  Normal appearance.  HENT:     Head: Normocephalic.     Mouth/Throat:     Mouth: Mucous membranes are moist.     Pharynx: Oropharynx is clear.  Cardiovascular:     Rate and Rhythm: Normal rate and regular rhythm.  Pulmonary:     Effort: Pulmonary effort is normal.     Breath sounds: Normal breath sounds.  Musculoskeletal:        General: Normal range of motion.     Cervical back: Normal range of motion and neck supple.  Skin:    General: Skin is warm and dry.  Neurological:     General: No focal deficit present.     Mental Status: He is alert and oriented to person, place, and time. Mental status is at baseline.  Psychiatric:        Mood and Affect: Mood normal.        Behavior: Behavior normal.        Thought Content: Thought content normal.        Judgment: Judgment normal.      Lab Results:  CBC    Component Value Date/Time   WBC 6.9 06/04/2021 0435   RBC 5.01 06/04/2021 0435   HGB 13.3 06/04/2021 0502   HCT 39.0 06/04/2021 0502   PLT 248 06/04/2021  0435   MCV 77.0 (L) 06/04/2021 0435   MCH 24.8 (L) 06/04/2021 0435   MCHC 32.1 06/04/2021 0435   RDW 13.2 06/04/2021 0435   LYMPHSABS 1.9 08/15/2020 0125   MONOABS 0.5 08/15/2020 0125   EOSABS 0.0 08/15/2020 0125   BASOSABS 0.0 08/15/2020 0125    BMET    Component Value Date/Time   NA 140 09/21/2022 1047   K 4.3 09/21/2022 1047   CL 103 09/21/2022 1047   CO2 19 (L) 09/21/2022 1047   GLUCOSE 102 (H) 09/21/2022 1047   GLUCOSE 131 (H) 06/04/2021 0502   BUN 11 09/21/2022 1047   CREATININE 0.89 09/21/2022 1047   CALCIUM 9.9 09/21/2022 1047   GFRNONAA >60 06/04/2021 0435   GFRAA  12/07/2008 1850    >60        The eGFR has been calculated using the MDRD equation. This calculation has not been validated in all clinical situations. eGFR's persistently <60 mL/min signify possible Chronic Kidney Disease.    BNP No results found for: "BNP"  ProBNP No results found for: "PROBNP"  Imaging: No results  found.   Assessment & Plan:   No problem-specific Assessment & Plan notes found for this encounter.     Martyn Ehrich, NP 11/01/2022

## 2022-11-06 DIAGNOSIS — R0683 Snoring: Secondary | ICD-10-CM | POA: Insufficient documentation

## 2022-11-06 NOTE — Assessment & Plan Note (Signed)
-   Patient has symptoms of loud snoring and witnessed apnea. Suspect patient has underlying sleep apnea. Needs home sleep testing. Reviewed risks of untreated sleep apnea including cardiac arrhythmias, pulmonary hypertension, diabetes and pulmonary embolism.  We discussed treatment options including weight loss, oral appliance, CPAP therapy or referral to ENT for possible surgical options.  Encourage patient work on weight loss efforts and focus on side sleeping position.  Advised against driving if experiencing excessive daytime sleepiness fatigue.  Patient to follow-up 1 to 2 weeks after sleep study to review results and treatment options if needed

## 2022-12-04 ENCOUNTER — Other Ambulatory Visit: Payer: Self-pay | Admitting: Student

## 2022-12-04 DIAGNOSIS — I10 Essential (primary) hypertension: Secondary | ICD-10-CM

## 2023-01-05 ENCOUNTER — Ambulatory Visit (INDEPENDENT_AMBULATORY_CARE_PROVIDER_SITE_OTHER): Payer: Commercial Managed Care - HMO | Admitting: Primary Care

## 2023-01-05 ENCOUNTER — Telehealth: Payer: Self-pay | Admitting: Pulmonary Disease

## 2023-01-05 DIAGNOSIS — G4733 Obstructive sleep apnea (adult) (pediatric): Secondary | ICD-10-CM | POA: Diagnosis not present

## 2023-01-05 DIAGNOSIS — R0683 Snoring: Secondary | ICD-10-CM

## 2023-01-05 NOTE — Telephone Encounter (Signed)
Please set him up with visit to review , virtual ok

## 2023-01-05 NOTE — Telephone Encounter (Signed)
Call patient  Sleep study result  Date of study: 12/18/2022  Impression: Severe obstructive sleep apnea Mild oxygen desaturations  Recommendation: DME referral  Recommend CPAP therapy for severe obstructive sleep apnea  Auto titrating CPAP with pressure settings of 5-20 will be appropriate  Encourage weight loss measures  Follow-up in the office 4 to 6 weeks following initiation of treatment

## 2023-01-08 NOTE — Progress Notes (Signed)
HST 12/18/22 showed severe OSA, AHI 46/hour. Need visit to review

## 2023-01-11 NOTE — Telephone Encounter (Signed)
Spoke with patient made a video visit to go over sleep study result's per [facility]   Call patient   Sleep study result   Date of study: 12/18/2022   Impression: Severe obstructive sleep apnea Mild oxygen desaturations   Recommendation: DME referral   Recommend CPAP therapy for severe obstructive sleep apnea   Auto titrating CPAP with pressure settings of 5-20 will be appropriate   Encourage weight loss measures   Follow-up in the office 4 to 6 weeks following initiation of treatment    Patient's voice was understanding.Nothing else further needed.

## 2023-01-26 NOTE — Progress Notes (Unsigned)
Virtual Visit via Video Note  I connected with Justin Walls on 01/26/23 at  9:00 AM EDT by a video enabled telemedicine application and verified that I am speaking with the correct person using two identifiers.  Location: Patient: Home Provider: Office    I discussed the limitations of evaluation and management by telemedicine and the availability of in person appointments. The patient expressed understanding and agreed to proceed.  History of Present Illness: 47 year old male. Former smoker.  Past medical history significant for hypertension.   Previous [facility] pulmonary encounter: 11/01/2022 Patient presents today for sleep consult. He has symptoms of loud snoring. Reports that he stops breathing at night.  Typical bedtime is between 9 and 11 PM.  It takes him on average 10 to 20 minutes to fall asleep.  He wakes up several times a night.  He starts his day at 6 AM.  His weight is down 20 pounds.  No previous sleep studies.  Denies symptoms of narcolepsy, cataplexy or sleepwalking.  Epworth score is 6.  01/29/2023 - Interim hx  Patient contacted today to review sleep study results.  He has symptoms of snoring and restless sleep. Patient had a home sleep study on 12/18/2022 with [facility] that showed severe obstructive sleep apnea, AHI 61 an hour with SpO2 low 83%.  Patient had 206 apneas (162 obstructive apneas) and 103 hypopneas.  Patient spent 6 minutes with an oxygen level less than 88%.  We reviewed sleep study results and treatment options.  Recommending patient be started on CPAP due to severity of OSA.  Patient in agreement with plan.   Observations/Objective:  - Appears well; No overt shortness of breath, wheezing or cough   Assessment and Plan:  Severe sleep apnea: - Clinically symptomatic, experiencing snoring symptoms and restless sleep - Hst 12/18/22 >> AHI 61/hour with SpO2 low 83% - Due to severity of OSA, recommending patient be started on auto CPAP 5 to 20 cm H2O with mask  of choice. Patient in agreement with plan - Patient aim to wear CPAP nightly for 4 to 6 hours or longer, encourage weight loss efforts and advised against driving if experiencing excessive daytime sleepiness fatigue   Follow Up Instructions:  - 6 weeks for CPAP compliance     I discussed the assessment and treatment plan with the patient. The patient was provided an opportunity to ask questions and all were answered. The patient agreed with the plan and demonstrated an understanding of the instructions.   The patient was advised to call back or seek an in-person evaluation if the symptoms worsen or if the condition fails to improve as anticipated.  I provided 22 minutes of non-face-to-face time during this encounter.   Glenford Bayley, NP

## 2023-01-29 ENCOUNTER — Telehealth (INDEPENDENT_AMBULATORY_CARE_PROVIDER_SITE_OTHER): Payer: Commercial Managed Care - HMO | Admitting: Primary Care

## 2023-01-29 ENCOUNTER — Telehealth: Payer: Commercial Managed Care - HMO | Admitting: Primary Care

## 2023-01-29 DIAGNOSIS — G4730 Sleep apnea, unspecified: Secondary | ICD-10-CM

## 2023-01-29 NOTE — Patient Instructions (Signed)
Sleep study showed evidence of severe obstructive sleep apnea, you had on average 61 apneic events an hour  Recommend starting on CPAP due to severity of your OSA and clinical symptoms of snoring and restless sleep  Once you receive CPAP him to wear nightly for 4 to 6 hours or longer  Follow-up: 6 weeks with Beth NP for CPAP compliance    CPAP and BIPAP Information CPAP and BIPAP are methods that use air pressure to keep your airways open and to help you breathe well. CPAP and BIPAP use different amounts of pressure. Your health care provider will tell you whether CPAP or BIPAP would be more helpful for you. CPAP stands for "continuous positive airway pressure." With CPAP, the amount of pressure stays the same while you breathe in (inhale) and out (exhale). BIPAP stands for "bi-level positive airway pressure." With BIPAP, the amount of pressure will be higher when you inhale and lower when you exhale. This allows you to take larger breaths. CPAP or BIPAP may be used in the hospital, or your health care provider may want you to use it at home. You may need to have a sleep study before your health care provider can order a machine for you to use at home. What are the advantages? CPAP or BIPAP can be helpful if you have: Sleep apnea. Chronic obstructive pulmonary disease (COPD). Heart failure. Medical conditions that cause muscle weakness, including muscular dystrophy or amyotrophic lateral sclerosis (ALS). Other problems that cause breathing to be shallow, weak, abnormal, or difficult. CPAP and BIPAP are most commonly used for obstructive sleep apnea (OSA) to keep the airways from collapsing when the muscles relax during sleep. What are the risks? Generally, this is a safe treatment. However, problems may occur, including: Irritated skin or skin sores if the mask does not fit properly. Dry or stuffy nose or nosebleeds. Dry mouth. Feeling gassy or bloated. Sinus or lung infection if the  equipment is not cleaned properly. When should CPAP or BIPAP be used? In most cases, the mask only needs to be worn during sleep. Generally, the mask needs to be worn throughout the night and during any daytime naps. People with certain medical conditions may also need to wear the mask at other times, such as when they are awake. Follow instructions from your health care provider about when to use the machine. What happens during CPAP or BIPAP?  Both CPAP and BIPAP are provided by a small machine with a flexible plastic tube that attaches to a plastic mask that you wear. Air is blown through the mask into your nose or mouth. The amount of pressure that is used to blow the air can be adjusted on the machine. Your health care provider will set the pressure setting and help you find the best mask for you. Tips for using the mask Because the mask needs to be snug, some people feel trapped or closed-in (claustrophobic) when first using the mask. If you feel this way, you may need to get used to the mask. One way to do this is to hold the mask loosely over your nose or mouth and then gradually apply the mask more snugly. You can also gradually increase the amount of time that you use the mask. Masks are available in various types and sizes. If your mask does not fit well, talk with your health care provider about getting a different one. Some common types of masks include: Full face masks, which fit over the mouth and  nose. Nasal masks, which fit over the nose. Nasal pillow or prong masks, which fit into the nostrils. If you are using a mask that fits over your nose and you tend to breathe through your mouth, a chin strap may be applied to help keep your mouth closed. Use a skin barrier to protect your skin as told by your health care provider. Some CPAP and BIPAP machines have alarms that may sound if the mask comes off or develops a leak. If you have trouble with the mask, it is very important that you  talk with your health care provider about finding a way to make the mask easier to tolerate. Do not stop using the mask. There could be a negative impact on your health if you stop using the mask. Tips for using the machine Place your CPAP or BIPAP machine on a secure table or stand near an electrical outlet. Know where the on/off switch is on the machine. Follow instructions from your health care provider about how to set the pressure on your machine and when you should use it. Do not eat or drink while the CPAP or BIPAP machine is on. Food or fluids could get pushed into your lungs by the pressure of the CPAP or BIPAP. For home use, CPAP and BIPAP machines can be rented or purchased through home health care companies. Many different brands of machines are available. Renting a machine before purchasing may help you find out which particular machine works well for you. Your health insurance company may also decide which machine you may get. Keep the CPAP or BIPAP machine and attachments clean. Ask your health care provider for specific instructions. Check the humidifier if you have a dry stuffy nose or nosebleeds. Make sure it is working correctly. Follow these instructions at home: Take over-the-counter and prescription medicines only as told by your health care provider. Ask if you can take sinus medicine if your sinuses are blocked. Do not use any products that contain nicotine or tobacco. These products include cigarettes, chewing tobacco, and vaping devices, such as e-cigarettes. If you need help quitting, ask your health care provider. Keep all follow-up visits. This is important. Contact a health care provider if: You have redness or pressure sores on your head, face, mouth, or nose from the mask or head gear. You have trouble using the CPAP or BIPAP machine. You cannot tolerate wearing the CPAP or BIPAP mask. Someone tells you that you snore even when wearing your CPAP or BIPAP. Get help  right away if: You have trouble breathing. You feel confused. Summary CPAP and BIPAP are methods that use air pressure to keep your airways open and to help you breathe well. If you have trouble with the mask, it is very important that you talk with your health care provider about finding a way to make the mask easier to tolerate. Do not stop using the mask. There could be a negative impact to your health if you stop using the mask. Follow instructions from your health care provider about when to use the machine. This information is not intended to replace advice given to you by your health care provider. Make sure you discuss any questions you have with your health care provider. Document Revised: 04/06/2021 Document Reviewed: 08/06/2020 Elsevier Patient Education  2023 [employer].

## 2023-01-29 NOTE — Progress Notes (Signed)
Reviewed and agree with assessment/plan.   Wallace Cogliano, MD [facility] 01/29/2023, 12:04 PM Pager:  [phone]  

## 2023-02-25 ENCOUNTER — Other Ambulatory Visit: Payer: Self-pay | Admitting: Student

## 2023-02-25 DIAGNOSIS — I10 Essential (primary) hypertension: Secondary | ICD-10-CM

## 2023-04-30 ENCOUNTER — Other Ambulatory Visit: Payer: Self-pay | Admitting: Student

## 2023-04-30 DIAGNOSIS — I10 Essential (primary) hypertension: Secondary | ICD-10-CM

## 2023-05-14 ENCOUNTER — Ambulatory Visit
Admission: EM | Admit: 2023-05-14 | Discharge: 2023-05-14 | Disposition: A | Discharge status: Home / Self Care | Payer: Commercial Managed Care - HMO

## 2023-05-14 DIAGNOSIS — S61215A Laceration without foreign body of left ring finger without damage to nail, initial encounter: Secondary | ICD-10-CM

## 2023-05-14 NOTE — Discharge Instructions (Signed)
Keep wound covered until healed over and monitor for signs of infection that include increased redness, swelling, pus.  Follow-up sooner if this occurs.

## 2023-05-14 NOTE — ED Triage Notes (Signed)
Pt cut his L-ring finger on piece of metal yesterday morning. Pt states last tetanus was 2022.

## 2023-05-14 NOTE — ED Provider Notes (Signed)
[facility]    CSN: [id number] Arrival date & time: 05/14/23  1002      History   Chief Complaint Chief Complaint  Patient presents with   Finger Injury    HPI Justin Walls is a 47 y.o. male.   Patient presents with laceration to left fourth digit that occurred at 1 AM approximately 2 days ago.  Patient states that he has washed it.  Last tetanus vaccine was in 2022.  Patient states that the laceration occurred while he was trying to start a generator, and he hit it on a metal piece on the generator.     Past Medical History:  Diagnosis Date   Asthma    Hypertension     Patient Active Problem List   Diagnosis Date Noted   Loud snoring 11/06/2022   Bilateral hip joint arthritis 09/21/2022   Primary hypertension 09/21/2022    Past Surgical History:  Procedure Laterality Date   HIP SURGERY     hip sx         Home Medications    Prior to Admission medications   Medication Sig Start Date End Date Taking? Authorizing Provider  losartan-hydrochlorothiazide (HYZAAR) 50-12.5 MG tablet TAKE 1 TABLET BY MOUTH EVERY DAY 05/01/23   Tiffany Kocher, DO  meloxicam (MOBIC) 15 MG tablet Take 15 mg by mouth daily. 11/30/22   [provider]  albuterol (PROVENTIL HFA;VENTOLIN HFA) 108 (90 BASE) MCG/ACT inhaler Inhale 1-2 puffs into the lungs every 6 (six) hours as needed for wheezing. 10/03/12 05/08/19  Reuben Likes, MD    Family History Family History  Problem Relation Age of Onset   Diabetes Mother    Hypertension Mother    Healthy Father     Social History Social History   Tobacco Use   Smoking status: Former    Current packs/day: 0.00    Average packs/day: 0.3 packs/day for 20.0 years (5.0 ttl pk-yrs)    Types: Cigarettes    Start date: 49    Quit date: 2009    Years since quitting: 15.6   Smokeless tobacco: Never  Vaping Use   Vaping status: Never Used  Substance Use Topics   Alcohol use: Yes   Drug use: Yes    Types: Marijuana     Comment: twice a month     Allergies   Amoxicillin and Amoxicillin   Review of Systems Review of Systems Per HPI  Physical Exam Triage Vital Signs ED Triage Vitals [05/14/23 1046]  Encounter Vitals Group     BP (!) 147/98     Systolic BP Percentile      Diastolic BP Percentile      Pulse Rate 75     Resp 18     Temp 98 F (36.7 C)     Temp Source Oral     SpO2 97 %     Weight      Height      Head Circumference      Peak Flow      Pain Score      Pain Loc      Pain Education      Exclude from Growth Chart    No data found.  Updated Vital Signs BP (!) 147/98 (BP Location: Left Arm)   Pulse 75   Temp 98 F (36.7 C) (Oral)   Resp 18   SpO2 97%   Visual Acuity Right Eye Distance:   Left Eye Distance:   Bilateral Distance:  Right Eye Near:   Left Eye Near:    Bilateral Near:     Physical Exam Constitutional:      General: He is not in acute distress.    Appearance: Normal appearance. He is not toxic-appearing or diaphoretic.  HENT:     Head: Normocephalic and atraumatic.  Eyes:     Extraocular Movements: Extraocular movements intact.     Conjunctiva/sclera: Conjunctivae normal.  Pulmonary:     Effort: Pulmonary effort is normal.  Skin:    Comments: Unable to determine length or depth of laceration given wound edges are now closely approximated.  It appears that laceration was present to the lateral portion of the pad of the left fourth digit.  Nail is intact with no nail involvement.  No bleeding noted.  Capillary refill and pulses intact.  Patient has full range of motion of finger.   Neurological:     General: No focal deficit present.     Mental Status: He is alert and oriented to person, place, and time. Mental status is at baseline.  Psychiatric:        Mood and Affect: Mood normal.        Behavior: Behavior normal.        Thought Content: Thought content normal.        Judgment: Judgment normal.      UC Treatments / Results   Labs (all labs ordered are listed, but only abnormal results are displayed) Labs Reviewed - No data to display  EKG   Radiology No results found.  Procedures Procedures (including critical care time)  Medications Ordered in UC Medications - No data to display  Initial Impression / Assessment and Plan / UC Course  I have reviewed the triage vital signs and the nursing notes.  Pertinent labs & imaging results that were available during my care of the patient were reviewed by me and considered in my medical decision making (see chart for details).     No need for closure or sutures at this time as wound is healing well on its own with close approximation of wound edges.  No signs of complication or infection.  Patient appears to be neurovascularly intact.  Wound was cleansed and nonadherent dressing applied by clinical staff prior to discharge.  Patient was advised of daily and as needed dressing changes.  Advised to monitor for signs of infection and follow-up sooner if they occur.  Tetanus vaccine up-to-date in 2022 per patient report.  Patient verbalized understanding and was agreeable with plan. Final Clinical Impressions(s) / UC Diagnoses   Final diagnoses:  Laceration of left ring finger without foreign body without damage to nail, initial encounter     Discharge Instructions      Keep wound covered until healed over and monitor for signs of infection that include increased redness, swelling, pus.  Follow-up sooner if this occurs.    ED Prescriptions   None    PDMP not reviewed this encounter.   Gustavus Bryant, [place] 05/14/23 [phone]

## 2023-07-11 ENCOUNTER — Other Ambulatory Visit: Payer: Self-pay | Admitting: Student

## 2023-07-11 DIAGNOSIS — I10 Essential (primary) hypertension: Secondary | ICD-10-CM

## 2023-11-21 ENCOUNTER — Other Ambulatory Visit: Payer: Self-pay | Admitting: Student

## 2023-11-21 DIAGNOSIS — I10 Essential (primary) hypertension: Secondary | ICD-10-CM

## 2023-12-19 ENCOUNTER — Encounter: Payer: Self-pay | Admitting: Student

## 2023-12-19 ENCOUNTER — Ambulatory Visit: Admitting: Student

## 2023-12-19 VITALS — BP 139/94 | HR 66 | Ht 74.0 in | Wt 222.2 lb

## 2023-12-19 DIAGNOSIS — I10 Essential (primary) hypertension: Secondary | ICD-10-CM | POA: Diagnosis not present

## 2023-12-19 DIAGNOSIS — Z0000 Encounter for general adult medical examination without abnormal findings: Secondary | ICD-10-CM | POA: Diagnosis not present

## 2023-12-19 DIAGNOSIS — Z1211 Encounter for screening for malignant neoplasm of colon: Secondary | ICD-10-CM | POA: Diagnosis not present

## 2023-12-19 DIAGNOSIS — M25512 Pain in left shoulder: Secondary | ICD-10-CM

## 2023-12-19 DIAGNOSIS — M160 Bilateral primary osteoarthritis of hip: Secondary | ICD-10-CM

## 2023-12-19 DIAGNOSIS — R7303 Prediabetes: Secondary | ICD-10-CM | POA: Diagnosis not present

## 2023-12-19 DIAGNOSIS — G8929 Other chronic pain: Secondary | ICD-10-CM | POA: Diagnosis not present

## 2023-12-19 DIAGNOSIS — M25511 Pain in right shoulder: Secondary | ICD-10-CM

## 2023-12-19 LAB — POCT GLYCOSYLATED HEMOGLOBIN (HGB A1C): HbA1c, POC (prediabetic range): 5.6 % — AB (ref 5.7–6.4)

## 2023-12-19 NOTE — Progress Notes (Signed)
    SUBJECTIVE:   CHIEF COMPLAINT / HPI:   Annual exam  bilateral hip arthritis and chronic pain of bilateral shoulders  Patient presents for annual exam.  Did not take blood pressure medications today.  Reviewed health care gaps, and ordered appropriate screening today.  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  He was referred to orthopedics nearly a year ago.  However due to insurance change, he needs a new referral.  Will send referral to orthopedics.  Additionally is having bilateral shoulder pain, with some radiculopathy symptoms.  Discussed orthopedic referral as well.   OBJECTIVE:   BP (!) 145/92   Pulse 66   Ht 6\' 2"  (1.88 m)   Wt 222 lb 3.2 oz (100.8 kg)   SpO2 100%   BMI 28.53 kg/m    General: NAD, pleasant Cardio: RRR, no MRG. Cap Refill <2s. Respiratory: CTAB, normal wob on RA GI: Abdomen is soft, not tender, not distended. BS present Cervical neck/shoulders: No extremity, no ecchymosis, no swelling.  No TTP.  5/5 strength bilaterally.  Spurling negative. Skin: Warm and dry  ASSESSMENT/PLAN:   Assessment & Plan Annual physical exam - BMP/lipid panel - Colonoscopy ordered Primary hypertension Blood pressure remains elevated today.  Did not take blood pressure medication today. -Continue 50-12.5 mg Hyzaar daily - Follow-up in 2 weeks Bilateral hip joint arthritis Follows with orthopedic surgery.  Needs new referral due to insurance. - Orthopedic referral Chronic pain of both shoulders -Orthopedic referral Prediabetes - Follow-up A1c   Tiffany Kocher, DO [facility]

## 2023-12-19 NOTE — Patient Instructions (Addendum)
 It was great to see you! Thank you for allowing me to participate in your care!   I recommend that you always bring your medications to each appointment as this makes it easy to ensure we are on the correct medications and helps [place] not miss when refills are needed.  Our plans for today:  - I will send a referral to [facility] for you, if you do not hear back from them within the next week or two, I recommend calling them. -I have sent a referral to GI, they will call you to make appointment for colonoscopy - If your BP remains elevated after recheck, please schedule appointment to see Dr. Claudean Severance in about 2 weeks - We are checking some labs today, I will call you if they are abnormal will send you a MyChart message or a letter if they are normal.  If you do not hear about your labs in the next 2 weeks please let us know.  Take care and seek immediate care sooner if you develop any concerns. Please remember to show up 15 minutes before your scheduled appointment time!  Tiffany Kocher, DO [facility]

## 2023-12-19 NOTE — Assessment & Plan Note (Signed)
 Follows with orthopedic surgery.  Needs new referral due to insurance. - Orthopedic referral

## 2023-12-19 NOTE — Assessment & Plan Note (Signed)
 Blood pressure remains elevated today.  Did not take blood pressure medication today. -Continue 50-12.5 mg Hyzaar daily - Follow-up in 2 weeks

## 2023-12-20 LAB — BASIC METABOLIC PANEL WITH GFR
BUN/Creatinine Ratio: 12 (ref 9–20)
BUN: 11 mg/dL (ref 6–24)
CO2: 24 mmol/L (ref 20–29)
Calcium: 9.8 mg/dL (ref 8.7–10.2)
Chloride: 101 mmol/L (ref 96–106)
Creatinine, Ser: 0.95 mg/dL (ref 0.76–1.27)
Glucose: 88 mg/dL (ref 70–99)
Potassium: 4.2 mmol/L (ref 3.5–5.2)
Sodium: 139 mmol/L (ref 134–144)
eGFR: 100 mL/min/{1.73_m2} (ref >59)

## 2023-12-20 LAB — LIPID PANEL
Chol/HDL Ratio: 3.3 ratio (ref 0.0–5.0)
Cholesterol, Total: 169 mg/dL (ref 100–199)
HDL: 51 mg/dL (ref >39)
LDL Chol Calc (NIH): 103 mg/dL — ABNORMAL HIGH (ref 0–99)
Triglycerides: 81 mg/dL (ref 0–149)
VLDL Cholesterol Cal: 15 mg/dL (ref 5–40)

## 2023-12-21 ENCOUNTER — Encounter: Payer: Self-pay | Admitting: Student

## 2024-01-02 ENCOUNTER — Encounter: Payer: Self-pay | Admitting: Student

## 2024-01-02 ENCOUNTER — Ambulatory Visit: Admitting: Student

## 2024-01-02 VITALS — BP 120/70 | HR 62 | Ht 74.0 in | Wt 223.0 lb

## 2024-01-02 DIAGNOSIS — N50812 Left testicular pain: Secondary | ICD-10-CM

## 2024-01-02 DIAGNOSIS — I10 Essential (primary) hypertension: Secondary | ICD-10-CM | POA: Diagnosis present

## 2024-01-02 NOTE — Patient Instructions (Addendum)
 It was great to see you! Thank you for allowing me to participate in your care!   I recommend that you always bring your medications to each appointment as this makes it easy to ensure we are on the correct medications and helps us  not miss when refills are needed.  Our plans for today:  - Please follow-up in 1 month to discuss your pain  Take care and seek immediate care sooner if you develop any concerns. Please remember to show up 15 minutes before your scheduled appointment time!  Lavada Porteous, DO [facility]

## 2024-01-02 NOTE — Progress Notes (Signed)
   SUBJECTIVE:   CHIEF COMPLAINT / HPI:   Hypertension Patient presents today to follow-up on elevated blood pressure at last visit.  He has hypertension reports she currently takes Hyzaar 50-12.5 mg daily.  Reports compliance, no side effects.  Initial blood pressure of 134/90, repeat within goal.  No chest pain, palpitations, shortness of breath, headaches.  Left testicular pain Ongoing for 2-3 months.  Dull, posterior ache-1-2 episodes per week, lasting less than an hour.  No dysuria, dyspareunia, discharge.  Denies noticeable swelling.  No systemic symptoms such as fever, chills.  OBJECTIVE:   BP 120/70   Pulse 62   Ht 6\' 2"  (1.88 m)   Wt 223 lb (101.2 kg)   SpO2 100%   BMI 28.63 kg/m    General: NAD, pleasant, Cardio: RRR, no MRG. Cap Refill <2s. Respiratory: CTAB, normal wob on RA Skin: Warm and dry  ASSESSMENT/PLAN:   Assessment & Plan Primary hypertension Well-controlled. - Continue Hyzaar 50-12.5 mg daily Left testicular pain Dull, intermittent, less than 2 episodes per week, present for 2-3 months.  Prior smoking history.  Patient declined GU exam today.  Differential broad includes: Epididymitis, varicocele, malignancy. - Recommend follow-up in 1 month - Discussed importance of GU exam if ongoing pain, patient agreeable. - Consider testicular ultrasound, UA/culture, STI testing at next visit   Lavada Porteous, DO [facility]

## 2024-01-02 NOTE — Assessment & Plan Note (Signed)
 Well-controlled. - Continue Hyzaar 50-12.5 mg daily

## 2024-01-23 ENCOUNTER — Ambulatory Visit: Admitting: Student

## 2024-01-23 NOTE — Progress Notes (Deleted)
 Surgical Pre-operative Evaluation:  Procedure: *** Procedural risk: ***   low risk- ambulatory surgery outpatient, cataract, endoscopy, skin surgery, breast surgery High risk- peripheral vascular surgery, abdominal aorta surgery EVERYTHING ELSE IS INTERMEDIATE RISK (orthopedic surgery, head and neck procedures, hysterectomy/gyn surgeries, hernia repair, cholescystectomy)  Anesthesia: *** (general, spinal, regional block)  PMH: ***  Surgical History:  The patient denies*** any complication with anesthesia, bleeding, or post-operative confusion, nausea, or vomiting in prior surgical interventions. The patient denies any *** history of spinal surgeries.  Family History: The patient denies*** any family history of complications with anesthesia and VTE.  Social History: Tobacco Use: *** (Recommend smoking cessation anytime before surgery to improve wound healing, 4-8 weeks befter surgery reduces risk of respiratory complications) Alcohol Use: *** Other substance use: ***  Screening for OSA: STOP BANG score of ***, *** risk of OSA  Medication Adjustments: ***can delete and put your patient's specific medications*** - Generally hold ACEI/ARB, and diuretics on day of surgery - Generally continute steroids, amlodipine, BB, clonidine (risk of withdrawal associated with harm) - Continue statins, levothyroxine - SGLT2 inhibitors stopped 3-4 days prior to surgery, other oral and injectable non-insulin agents held on day of surgery - Insulin- reduce basal dose by 10-25% prior to surgery (either night before or morning of depending on how patient takes it) and hold prandial insulin on day of surgery - for DAPT- talk to cards! Earliest to possibly hold is 4-6 weeks after placement of BMS/DES - Antiplatelet- Plavix for a minimum of 5 days and continue aspirin if possible, prasugrel hold for 7 days, ticagrelor hold for 3 days - Aspirin - if for primary prevention hold, if for secondary prevention  continue if possible (if spinal surgery or high risk brain surgery hold for 5 days) - Anticoagulants:  - Consider bridging for patients on warfarin for mechanical mitral valve, mechanical aortic valve, high CHADS2VASC score- stop 5 days before surgery and proceed with surgery when INR < 1.5         - Apixaban- stop 24-36hrs before moderate risk surgery, 48+hrs before high risk surgery (depends on CrCl)  - Rivaroxaban - same as apixaban - Caribopda-levodopa- contiue through day of srugery as possible, attempt to scehdule first case of day (increase risk of confusion/rigidity when held) - SSRI/SNRI- consider holding if high risk procedure or aspirin therapy  Risk Stratification Tool: Chales Abrahams Calculator: *** % risk of MI or cardiac event, intraoperatively or up to 30 days post-op  Functional Capacity:  Walking 2 blocks = 2.75 METS  Climb 1 flight of stairs = 5.50 METS   Pushing the mower = 4.50 METS   Singles tennis = 8 METS   If patients can achieve > 4 METS- no stress test recommended (if they cannot, consider a stress test as this can change management)  Labs to Check: - CBC- if > 61 yo and undergoing intermediate or high risk surgery, or younger patient that may have significant blood loss - Renal function- if taking antihypertensives or meds that can affect renal function or diseases like T2DM that can affect renal function - A1c- reasonable to check for ages 43-70 who are overweight/obese, or known T2DM/prediabetes - Coagulation testing- if history of easy bleeding, known liver or renal disease, taking anticoagulants - BNP - if age  > 57yo, or age 49-64 with significant CV disease - Urinalysis- if undergoing implantation of prosthetic joint or valve, undergoing invasive urologic procedures  ECG: reasonable if known CV disease or other risk factors undergoing intermediate  or high risk surgery CXR: only for patients with new or unstable cardiopulmonary symptoms  Final  Assessment:  The patient is at low***intermediate***high*** risk of complications from a low***moderate***high*** risk surgery.  I recommend the following additional tests: I recommend the following changes to medications in the perioperative setting:

## 2024-01-24 ENCOUNTER — Telehealth: Payer: Self-pay | Admitting: Student

## 2024-01-24 DIAGNOSIS — G4730 Sleep apnea, unspecified: Secondary | ICD-10-CM | POA: Insufficient documentation

## 2024-01-24 NOTE — Telephone Encounter (Signed)
 Called patient, confirmed identity.  Recent visit in April 2025.  Calling patient for additional information for surgical risk assessment.  Surgical Pre-operative Evaluation:  Procedure: Left total hip arthroplasty Procedural risk: Intermediate risk  Anesthesia: Spinal  PMH: Hypertension Sleep apnea (Not on CPAP)  Surgical History:  The patient denies any complication with anesthesia, bleeding, or post-operative confusion, nausea, or vomiting in prior surgical interventions. The patient denies any history of spinal surgeries.  Family History: The patient denies any family history of complications with anesthesia and VTE.  Social History: Tobacco Use: Cigars (Recommend smoking cessation anytime before surgery to improve wound healing, 4-8 weeks befter surgery reduces risk of respiratory complications) Alcohol Use: Occasional Other substance use: None  Medication Adjustments: -Please hold your losartan -hydrochlorothiazide the day of your surgery.  You can then resume this the day after.  Risk Stratification Tool: Venice Gillis Calculator: 0.1 % risk of MI or cardiac event, intraoperatively or up to 30 days post-op  Functional Capacity: Meets >4 METS  Recommended laboratory work: None.  Recent BMP in April 2025 with normal renal function.  Final Assessment:  The patient is at low risk of complications from an intermediate risk surgery.  I recommend the following additional tests: None I recommend the following changes to medications in the perioperative setting: Please hold losartan -hydrochlorothiazide day of surgery, resume next day. Please inform anesthesiology of OSA and CPAP

## 2024-02-14 NOTE — Progress Notes (Signed)
 Sent message, via epic in basket, requesting orders in epic from [facility] adviser.

## 2024-02-19 ENCOUNTER — Ambulatory Visit: Payer: Self-pay | Admitting: Emergency Medicine

## 2024-02-19 DIAGNOSIS — G8929 Other chronic pain: Secondary | ICD-10-CM

## 2024-02-19 NOTE — Progress Notes (Signed)
 COVID Vaccine received:  [x]  No []  Yes Date of any COVID positive Test in last 90 days: no PCP - Lavada Porteous DO Cardiologist - n/a  Chest x-ray -  EKG -   Stress Test -  ECHO -  Cardiac Cath -   Bowel Prep - [x]  No  []   Yes ______  Pacemaker / ICD device [x]  No []  Yes   Spinal Cord Stimulator:[x]  No []  Yes       History of Sleep Apnea? []  No [x]  Yes   CPAP used?- [x]  No []  Yes    Does the patient monitor blood sugar?          [x]  No []  Yes  []  N/A  Patient has: []  NO Hx DM   [x]  Pre-DM                 []  DM1  []   DM2 Does patient have a [employer] or Dexacom? []  No []  Yes   Fasting Blood Sugar Ranges-  Checks Blood Sugar _____ times a day  GLP1 agonist / usual dose - no GLP1 instructions:  SGLT-2 inhibitors / usual dose - no SGLT-2 instructions:   Blood Thinner / Instructions:no Aspirin  Instructions:no  Comments:   Activity level: Patient is unable to climb a flight of stairs without difficulty; [x]  No CP  [x]  No SOB, but would have _hip dysfunction and pain__   Patient can perform ADLs without assistance.   Anesthesia review:   Patient denies shortness of breath, fever, cough and chest pain at PAT appointment.  Patient verbalized understanding and agreement to the Pre-Surgical Instructions that were given to them at this PAT appointment. Patient was also educated of the need to review these PAT instructions again prior to his/her surgery.I reviewed the appropriate phone numbers to call if they have any and questions or concerns.

## 2024-02-19 NOTE — Progress Notes (Signed)
 Second request for pre op orders in [facility]: Left voicemail for [employer].

## 2024-02-19 NOTE — H&P (Signed)
 TOTAL HIP ADMISSION H&P  Patient is admitted for left total hip arthroplasty.  Subjective:  Chief Complaint: left hip pain  HPI: Justin Walls, 48 y.o. male, has a history of pain and functional disability in the left hip(s) due to post-traumatic arthritis and patient has failed non-surgical conservative treatments for greater than 12 weeks to include NSAID's and/or analgesics, use of assistive devices, and activity modification.  Onset of symptoms was gradual starting >10 years ago with gradually worsening course since that time.The patient noted prior procedures of the hip to include fixation of SCFE on the left hip(s).  Patient currently rates pain in the left hip at 10 out of 10 with activity. Patient has night pain, worsening of pain with activity and weight bearing, pain that interfers with activities of daily living, and pain with passive range of motion. Patient has evidence of periarticular osteophytes and joint space narrowing by imaging studies. This condition presents safety issues increasing the risk of falls. This patient has had Open reduction internal fixation of SCFE in past.  There is no current active infection.  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   Allergies  Allergen Reactions   Amoxicillin Anaphylaxis and Rash    Social History   Tobacco Use   Smoking status: Former    Current packs/day: 0.00    Average packs/day: 0.3  packs/day for 20.0 years (5.0 ttl pk-yrs)    Types: Cigarettes    Start date: 15    Quit date: 2009    Years since quitting: 16.4   Smokeless tobacco: Never  Substance Use Topics   Alcohol use: Yes    Family History  Problem Relation Age of Onset   Diabetes Mother    Hypertension Mother    Healthy Father      Review of Systems  Musculoskeletal:  Positive for arthralgias.  All other systems reviewed and are negative.   Objective:  Physical Exam Constitutional:      General: He is not in acute distress.    Appearance: Normal appearance. He is normal weight.  HENT:     Head: Normocephalic and atraumatic.  Eyes:     Extraocular Movements: Extraocular movements intact.     Conjunctiva/sclera: Conjunctivae normal.     Pupils: Pupils are equal, round, and reactive to light.  Cardiovascular:     Rate and Rhythm: Normal rate and regular rhythm.     Pulses: Normal pulses.     Heart sounds: Normal heart sounds.  Pulmonary:     Effort: Pulmonary effort is normal. No respiratory distress.     Breath sounds: Normal breath sounds.  Abdominal:     General: Bowel sounds are normal. There is no distension.     Palpations: Abdomen is soft.     Tenderness: There is no abdominal tenderness.  Musculoskeletal:        General: Tenderness present.  Cervical back: Normal range of motion and neck supple.     Comments: TTP over groin, lateral aspect, greater trochanter.  Mild IT band tenderness.  No significant swelling.  No overlying lesions of area of chief complaint.  Decreased strength and ROM due to elicited pain.  Dorsiflexion and plantarflexion intact.  BLE appear grossly neurovascularly intact.  Gait antalgic.   Lymphadenopathy:     Cervical: No cervical adenopathy.  Skin:    General: Skin is warm and dry.     Capillary Refill: Capillary refill takes less than 2 seconds.     Findings: No erythema or rash.  Neurological:     General: No focal deficit present.     Mental  Status: He is alert and oriented to person, place, and time.  Psychiatric:        Mood and Affect: Mood normal.        Behavior: Behavior normal.     Vital signs in last 24 hours: @VSRANGES @  Labs:   Estimated body mass index is 28.63 kg/m as calculated from the following:   Height as of 01/02/24: 6\' 2"  (1.88 m).   Weight as of 01/02/24: 101.2 kg.   Imaging Review Plain radiographs demonstrate severe degenerative joint disease of the left hip(s). The bone quality appears to be fair for age and reported activity level.      Assessment/Plan:  Severe post-traumatic arthritis, left hip(s)  The patient history, physical examination, clinical judgement of the provider and imaging studies are consistent with severe post-traumatic end stage degenerative joint disease of the left hip(s) and total hip arthroplasty is deemed medically necessary. The treatment options including medical management, injection therapy, arthroscopy and arthroplasty were discussed at length. The risks and benefits of total hip arthroplasty were presented and reviewed. The risks due to aseptic loosening, infection, stiffness, dislocation/subluxation,  thromboembolic complications and other imponderables were discussed.  The patient acknowledged the explanation, agreed to proceed with the plan and consent was signed. Patient is being admitted for inpatient treatment for surgery, pain control, PT, OT, prophylactic antibiotics, VTE prophylaxis, progressive ambulation and ADL's and discharge planning.The patient is planning to be discharged home with outpatient PT.    Patient's anticipated LOS is less than 2 midnights, meeting these requirements: - Younger than 44 - Lives within 1 hour of care - Has a competent adult at home to recover with post-op recover - NO history of  - Chronic pain requiring opiods  - Diabetes  - Coronary Artery Disease  - Heart failure  - Heart attack  - Stroke  - DVT/VTE  - Cardiac  arrhythmia  - Respiratory Failure/COPD  - Renal failure  - Anemia  - Advanced Liver disease

## 2024-02-19 NOTE — H&P (View-Only) (Signed)
 TOTAL HIP ADMISSION H&P  Patient is admitted for left total hip arthroplasty.  Subjective:  Chief Complaint: left hip pain  HPI: Justin Walls, 48 y.o. male, has a history of pain and functional disability in the left hip(s) due to post-traumatic arthritis and patient has failed non-surgical conservative treatments for greater than 12 weeks to include NSAID's and/or analgesics, use of assistive devices, and activity modification.  Onset of symptoms was gradual starting >10 years ago with gradually worsening course since that time.The patient noted prior procedures of the hip to include fixation of SCFE on the left hip(s).  Patient currently rates pain in the left hip at 10 out of 10 with activity. Patient has night pain, worsening of pain with activity and weight bearing, pain that interfers with activities of daily living, and pain with passive range of motion. Patient has evidence of periarticular osteophytes and joint space narrowing by imaging studies. This condition presents safety issues increasing the risk of falls. This patient has had Open reduction internal fixation of SCFE in past.  There is no current active infection.  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   Allergies  Allergen Reactions   Amoxicillin Anaphylaxis and Rash    Social History   Tobacco Use   Smoking status: Former    Current packs/day: 0.00    Average packs/day: 0.3  packs/day for 20.0 years (5.0 ttl pk-yrs)    Types: Cigarettes    Start date: 15    Quit date: 2009    Years since quitting: 16.4   Smokeless tobacco: Never  Substance Use Topics   Alcohol use: Yes    Family History  Problem Relation Age of Onset   Diabetes Mother    Hypertension Mother    Healthy Father      Review of Systems  Musculoskeletal:  Positive for arthralgias.  All other systems reviewed and are negative.   Objective:  Physical Exam Constitutional:      General: He is not in acute distress.    Appearance: Normal appearance. He is normal weight.  HENT:     Head: Normocephalic and atraumatic.  Eyes:     Extraocular Movements: Extraocular movements intact.     Conjunctiva/sclera: Conjunctivae normal.     Pupils: Pupils are equal, round, and reactive to light.  Cardiovascular:     Rate and Rhythm: Normal rate and regular rhythm.     Pulses: Normal pulses.     Heart sounds: Normal heart sounds.  Pulmonary:     Effort: Pulmonary effort is normal. No respiratory distress.     Breath sounds: Normal breath sounds.  Abdominal:     General: Bowel sounds are normal. There is no distension.     Palpations: Abdomen is soft.     Tenderness: There is no abdominal tenderness.  Musculoskeletal:        General: Tenderness present.  Cervical back: Normal range of motion and neck supple.     Comments: TTP over groin, lateral aspect, greater trochanter.  Mild IT band tenderness.  No significant swelling.  No overlying lesions of area of chief complaint.  Decreased strength and ROM due to elicited pain.  Dorsiflexion and plantarflexion intact.  BLE appear grossly neurovascularly intact.  Gait antalgic.   Lymphadenopathy:     Cervical: No cervical adenopathy.  Skin:    General: Skin is warm and dry.     Capillary Refill: Capillary refill takes less than 2 seconds.     Findings: No erythema or rash.  Neurological:     General: No focal deficit present.     Mental  Status: He is alert and oriented to person, place, and time.  Psychiatric:        Mood and Affect: Mood normal.        Behavior: Behavior normal.     Vital signs in last 24 hours: @VSRANGES @  Labs:   Estimated body mass index is 28.63 kg/m as calculated from the following:   Height as of 01/02/24: 6\' 2"  (1.88 m).   Weight as of 01/02/24: 101.2 kg.   Imaging Review Plain radiographs demonstrate severe degenerative joint disease of the left hip(s). The bone quality appears to be fair for age and reported activity level.      Assessment/Plan:  Severe post-traumatic arthritis, left hip(s)  The patient history, physical examination, clinical judgement of the provider and imaging studies are consistent with severe post-traumatic end stage degenerative joint disease of the left hip(s) and total hip arthroplasty is deemed medically necessary. The treatment options including medical management, injection therapy, arthroscopy and arthroplasty were discussed at length. The risks and benefits of total hip arthroplasty were presented and reviewed. The risks due to aseptic loosening, infection, stiffness, dislocation/subluxation,  thromboembolic complications and other imponderables were discussed.  The patient acknowledged the explanation, agreed to proceed with the plan and consent was signed. Patient is being admitted for inpatient treatment for surgery, pain control, PT, OT, prophylactic antibiotics, VTE prophylaxis, progressive ambulation and ADL's and discharge planning.The patient is planning to be discharged home with outpatient PT.    Patient's anticipated LOS is less than 2 midnights, meeting these requirements: - Younger than 44 - Lives within 1 hour of care - Has a competent adult at home to recover with post-op recover - NO history of  - Chronic pain requiring opiods  - Diabetes  - Coronary Artery Disease  - Heart failure  - Heart attack  - Stroke  - DVT/VTE  - Cardiac  arrhythmia  - Respiratory Failure/COPD  - Renal failure  - Anemia  - Advanced Liver disease

## 2024-02-19 NOTE — Progress Notes (Signed)
 Notification sent to Dr. Pryor Browning to send pre op orders for PST visit. 02/21/24.

## 2024-02-19 NOTE — Patient Instructions (Signed)
 SURGICAL WAITING ROOM VISITATION  Patients having surgery or a procedure may have no more than 2 support people in the waiting area - these visitors may rotate.    Children under the age of 78 must have an adult with them who is not the patient.  Visitors with respiratory illnesses are discouraged from visiting and should remain at home.  If the patient needs to stay at the hospital during part of their recovery, the visitor guidelines for inpatient rooms apply. Pre-op nurse will coordinate an appropriate time for 1 support person to accompany patient in pre-op.  This support person may not rotate.    Please refer to the [facility] website for the visitor guidelines for Inpatients (after your surgery is over and you are in a regular room).       Your procedure is scheduled on: 03/05/24   Report to [facility] Main Entrance    Report to admitting at 5:15 AM   Call this number if you have problems the morning of surgery [phone]   Do not eat food :After Midnight.   After Midnight you may have the following liquids until ______ AM/ PM DAY OF SURGERY  Water Non-Citrus Juices (without pulp, NO RED-Apple, White grape, White cranberry) Black Coffee (NO MILK/CREAM OR CREAMERS, sugar ok)  Clear Tea (NO MILK/CREAM OR CREAMERS, sugar ok) regular and decaf                             Plain Jell-O (NO RED)                                           Fruit ices (not with fruit pulp, NO RED)                                     Popsicles (NO RED)                                                               Sports drinks like Gatorade (NO RED)                The day of surgery:  Drink ONE (1) Pre-Surgery Clear Ensure at AM the morning of surgery. Drink in one sitting. Do not sip.  This drink was given to you during your hospital  pre-op appointment visit. Nothing else to drink after completing the  Pre-Surgery Clear Ensure or G2.     Oral Hygiene is also important to reduce  your risk of infection.                                    Remember - BRUSH YOUR TEETH THE MORNING OF SURGERY WITH YOUR REGULAR TOOTHPASTE   Stop all vitamins and herbal supplements 7 days before surgery.   Take these medicines the morning of surgery with A SIP OF WATER: None  Bring CPAP mask and tubing day of surgery.  You may not have any metal on your body including hair pins, jewelry, and body piercing             Do not wear make-up, lotions, powders, perfumes/cologne, or deodorant              Men may shave face and neck.   Do not bring valuables to the hospital. [facility] IS NOT             RESPONSIBLE   FOR VALUABLES.   Contacts, glasses, dentures or bridgework may not be worn into surgery.  DO NOT BRING YOUR HOME MEDICATIONS TO THE HOSPITAL. PHARMACY WILL DISPENSE MEDICATIONS LISTED ON YOUR MEDICATION LIST TO YOU DURING YOUR ADMISSION IN THE HOSPITAL!    Patients discharged on the day of surgery will not be allowed to drive home.  Someone NEEDS to stay with you for the first 24 hours after anesthesia.   Special Instructions: Bring a copy of your healthcare power of attorney and living will documents the day of surgery if you haven't scanned them before.              Please read over the following fact sheets you were given: IF YOU HAVE QUESTIONS ABOUT YOUR PRE-OP INSTRUCTIONS PLEASE CALL [phone] Justin Walls   If you received a COVID test during your pre-op visit  it is requested that you wear a mask when out in public, stay away from anyone that may not be feeling well and notify your surgeon if you develop symptoms. If you test positive for Covid or have been in contact with anyone that has tested positive in the last 10 days please notify you surgeon.      Pre-operative 5 CHG Bath Instructions   You can play a key role in reducing the risk of infection after surgery. Your skin needs to be as free of germs as possible. You can reduce the  number of germs on your skin by washing with CHG (chlorhexidine gluconate) soap before surgery. CHG is an antiseptic soap that kills germs and continues to kill germs even after washing.   DO NOT use if you have an allergy to chlorhexidine/CHG or antibacterial soaps. If your skin becomes reddened or irritated, stop using the CHG and notify one of our RNs at [phone].   Please shower with the CHG soap starting 4 days before surgery using the following schedule:     Please keep in mind the following:  DO NOT shave, including legs and underarms, starting the day of your first shower.   You may shave your face at any point before/day of surgery.  Place clean sheets on your bed the day you start using CHG soap. Use a clean washcloth (not used since being washed) for each shower. DO NOT sleep with pets once you start using the CHG.   CHG Shower Instructions:  If you choose to wash your hair and private area, wash first with your normal shampoo/soap.  After you use shampoo/soap, rinse your hair and body thoroughly to remove shampoo/soap residue.  Turn the water OFF and apply about 3 tablespoons (45 ml) of CHG soap to a CLEAN washcloth.  Apply CHG soap ONLY FROM YOUR NECK DOWN TO YOUR TOES (washing for 3-5 minutes)  DO NOT use CHG soap on face, private areas, open wounds, or sores.  Pay special attention to the area where your surgery is being performed.  If you are having back surgery, having someone wash your back for you  may be helpful. Wait 2 minutes after CHG soap is applied, then you may rinse off the CHG soap.  Pat dry with a clean towel  Put on clean clothes/pajamas   If you choose to wear lotion, please use ONLY the CHG-compatible lotions on the back of this paper.     Additional instructions for the day of surgery: DO NOT APPLY any lotions, deodorants, cologne, or perfumes.   Put on clean/comfortable clothes.  Brush your teeth.  Ask your nurse before applying any prescription  medications to the skin.      CHG Compatible Lotions   Aveeno Moisturizing lotion  Cetaphil Moisturizing Cream  Cetaphil Moisturizing Lotion  Clairol Herbal Essence Moisturizing Lotion, Dry Skin  Clairol Herbal Essence Moisturizing Lotion, Extra Dry Skin  Clairol Herbal Essence Moisturizing Lotion, Normal Skin  Curel Age Defying Therapeutic Moisturizing Lotion with Alpha Hydroxy  Curel Extreme Care Body Lotion  Curel Soothing Hands Moisturizing Hand Lotion  Curel Therapeutic Moisturizing Cream, Fragrance-Free  Curel Therapeutic Moisturizing Lotion, Fragrance-Free  Curel Therapeutic Moisturizing Lotion, Original Formula  Eucerin Daily Replenishing Lotion  Eucerin Dry Skin Therapy Plus Alpha Hydroxy Crme  Eucerin Dry Skin Therapy Plus Alpha Hydroxy Lotion  Eucerin Original Crme  Eucerin Original Lotion  Eucerin Plus Crme Eucerin Plus Lotion  Eucerin TriLipid Replenishing Lotion  Keri Anti-Bacterial Hand Lotion  Keri Deep Conditioning Original Lotion Dry Skin Formula Softly Scented  Keri Deep Conditioning Original Lotion, Fragrance Free Sensitive Skin Formula  Keri Lotion Fast Absorbing Fragrance Free Sensitive Skin Formula  Keri Lotion Fast Absorbing Softly Scented Dry Skin Formula  Keri Original Lotion  Keri Skin Renewal Lotion Keri Silky Smooth Lotion  Keri Silky Smooth Sensitive Skin Lotion  Nivea Body Creamy Conditioning Oil  Nivea Body Extra Enriched Teacher, adult education Moisturizing Lotion Nivea Crme  Nivea Skin Firming Lotion  NutraDerm 30 Skin Lotion  NutraDerm Skin Lotion  NutraDerm Therapeutic Skin Cream  NutraDerm Therapeutic Skin Lotion  ProShield Protective Hand Cream   WHAT IS A BLOOD TRANSFUSION? Blood Transfusion Information  A transfusion is the replacement of blood or some of its parts. Blood is made up of multiple cells which provide different functions. Red blood cells carry oxygen and are used for blood loss  replacement. White blood cells fight against infection. Platelets control bleeding. Plasma helps clot blood. Other blood products are available for specialized needs, such as hemophilia or other clotting disorders. BEFORE THE TRANSFUSION  Who gives blood for transfusions?  Healthy volunteers who are fully evaluated to make sure their blood is safe. This is blood bank blood. Transfusion therapy is the safest it has ever been in the practice of medicine. Before blood is taken from a donor, a complete history is taken to make sure that person has no history of diseases nor engages in risky social behavior (examples are intravenous drug use or sexual activity with multiple partners). The donor's travel history is screened to minimize risk of transmitting infections, such as malaria. The donated blood is tested for signs of infectious diseases, such as HIV and hepatitis. The blood is then tested to be sure it is compatible with you in order to minimize the chance of a transfusion reaction. If you or a relative donates blood, this is often done in anticipation of surgery and is not appropriate for emergency situations. It takes many days to process the donated blood. RISKS AND COMPLICATIONS Although transfusion therapy is very safe and saves many lives, the  main dangers of transfusion include:  Getting an infectious disease. Developing a transfusion reaction. This is an allergic reaction to something in the blood you were given. Every precaution is taken to prevent this. The decision to have a blood transfusion has been considered carefully by your caregiver before blood is given. Blood is not given unless the benefits outweigh the risks. AFTER THE TRANSFUSION Right after receiving a blood transfusion, you will usually feel much better and more energetic. This is especially true if your red blood cells have gotten low (anemic). The transfusion raises the level of the red blood cells which carry oxygen, and  this usually causes an energy increase. The nurse administering the transfusion will monitor you carefully for complications. HOME CARE INSTRUCTIONS  No special instructions are needed after a transfusion. You may find your energy is better. Speak with your caregiver about any limitations on activity for underlying diseases you may have. SEEK MEDICAL CARE IF:  Your condition is not improving after your transfusion. You develop redness or irritation at the intravenous (IV) site. SEEK IMMEDIATE MEDICAL CARE IF:  Any of the following symptoms occur over the next 12 hours: Shaking chills. You have a temperature by mouth above 102 F (38.9 C), not controlled by medicine. Chest, back, or muscle pain. People around you feel you are not acting correctly or are confused. Shortness of breath or difficulty breathing. Dizziness and fainting. You get a rash or develop hives. You have a decrease in urine output. Your urine turns a dark color or changes to pink, red, or brown. Any of the following symptoms occur over the next 10 days: You have a temperature by mouth above 102 F (38.9 C), not controlled by medicine. Shortness of breath. Weakness after normal activity. The white part of the eye turns yellow (jaundice). You have a decrease in the amount of urine or are urinating less often. Your urine turns a dark color or changes to pink, red, or brown. Document Released: 08/25/2000 Document Revised: 11/20/2011 Document Reviewed: 04/13/2008 [employer] Patient Information 2014 [place], [place].

## 2024-02-20 ENCOUNTER — Encounter: Payer: Self-pay | Admitting: Family Medicine

## 2024-02-21 ENCOUNTER — Encounter (HOSPITAL_COMMUNITY): Payer: Self-pay

## 2024-02-21 ENCOUNTER — Other Ambulatory Visit: Payer: Self-pay

## 2024-02-21 ENCOUNTER — Encounter (HOSPITAL_COMMUNITY)
Admission: RE | Admit: 2024-02-21 | Discharge: 2024-02-21 | Disposition: A | Discharge status: Home / Self Care | Source: Ambulatory Visit | Attending: Orthopedic Surgery | Admitting: Orthopedic Surgery

## 2024-02-21 VITALS — BP 149/100 | HR 72 | Temp 98.6°F | Resp 18 | Ht 74.0 in | Wt 213.0 lb

## 2024-02-21 DIAGNOSIS — I10 Essential (primary) hypertension: Secondary | ICD-10-CM | POA: Insufficient documentation

## 2024-02-21 DIAGNOSIS — Z01818 Encounter for other preprocedural examination: Secondary | ICD-10-CM | POA: Insufficient documentation

## 2024-02-21 DIAGNOSIS — M25552 Pain in left hip: Secondary | ICD-10-CM | POA: Diagnosis not present

## 2024-02-21 DIAGNOSIS — G8929 Other chronic pain: Secondary | ICD-10-CM | POA: Diagnosis not present

## 2024-02-21 HISTORY — DX: Unspecified osteoarthritis, unspecified site: M19.90

## 2024-02-21 LAB — CBC WITH DIFFERENTIAL/PLATELET
Abs Immature Granulocytes: 0.01 10*3/uL (ref 0.00–0.07)
Basophils Absolute: 0 10*3/uL (ref 0.0–0.1)
Basophils Relative: 1 %
Eosinophils Absolute: 0.2 10*3/uL (ref 0.0–0.5)
Eosinophils Relative: 3 %
HCT: 37.6 % — ABNORMAL LOW (ref 39.0–52.0)
Hemoglobin: 11.7 g/dL — ABNORMAL LOW (ref 13.0–17.0)
Immature Granulocytes: 0 %
Lymphocytes Relative: 61 %
Lymphs Abs: 2.7 10*3/uL (ref 0.7–4.0)
MCH: 24.8 pg — ABNORMAL LOW (ref 26.0–34.0)
MCHC: 31.1 g/dL (ref 30.0–36.0)
MCV: 79.8 fL — ABNORMAL LOW (ref 80.0–100.0)
Monocytes Absolute: 0.3 10*3/uL (ref 0.1–1.0)
Monocytes Relative: 8 %
Neutro Abs: 1.2 10*3/uL — ABNORMAL LOW (ref 1.7–7.7)
Neutrophils Relative %: 27 %
Platelets: 242 10*3/uL (ref 150–400)
RBC: 4.71 MIL/uL (ref 4.22–5.81)
RDW: 12.9 % (ref 11.5–15.5)
WBC: 4.5 10*3/uL (ref 4.0–10.5)
nRBC: 0 % (ref 0.0–0.2)

## 2024-02-21 LAB — COMPREHENSIVE METABOLIC PANEL WITH GFR
ALT: 20 U/L (ref 0–44)
AST: 23 U/L (ref 15–41)
Albumin: 4.4 g/dL (ref 3.5–5.0)
Alkaline Phosphatase: 104 U/L (ref 38–126)
Anion gap: 10 (ref 5–15)
BUN: 14 mg/dL (ref 6–20)
CO2: 25 mmol/L (ref 22–32)
Calcium: 9.5 mg/dL (ref 8.9–10.3)
Chloride: 105 mmol/L (ref 98–111)
Creatinine, Ser: 0.85 mg/dL (ref 0.61–1.24)
GFR, Estimated: >60 mL/min (ref >60)
Glucose, Bld: 112 mg/dL — ABNORMAL HIGH (ref 70–99)
Potassium: 3.3 mmol/L — ABNORMAL LOW (ref 3.5–5.1)
Sodium: 140 mmol/L (ref 135–145)
Total Bilirubin: 0.8 mg/dL (ref 0.0–1.2)
Total Protein: 7.6 g/dL (ref 6.5–8.1)

## 2024-02-21 LAB — TYPE AND SCREEN
ABO/RH(D): O POS
Antibody Screen: NEGATIVE

## 2024-02-21 LAB — SURGICAL PCR SCREEN
MRSA, PCR: NEGATIVE
Staphylococcus aureus: NEGATIVE

## 2024-03-04 NOTE — Anesthesia Preprocedure Evaluation (Signed)
 Anesthesia Evaluation  Patient identified by MRN, date of birth, ID band Patient awake    Reviewed: Allergy & Precautions, NPO status , Patient's Chart, lab work & pertinent test results  Airway Mallampati: I  TM Distance: >3 FB Neck ROM: Full    Dental no notable dental hx. (+) Teeth Intact, Dental Advisory Given   Pulmonary asthma , sleep apnea (no CPAP) , Patient abstained from smoking., former smoker   Pulmonary exam normal breath sounds clear to auscultation       Cardiovascular hypertension, Pt. on medications Normal cardiovascular exam Rhythm:Regular Rate:Normal     Neuro/Psych negative neurological ROS  negative psych ROS   GI/Hepatic negative GI ROS, Neg liver ROS,,,  Endo/Other  negative endocrine ROS    Renal/GU negative Renal ROS  negative genitourinary   Musculoskeletal  (+) Arthritis ,    Abdominal   Peds  Hematology negative hematology ROS (+)   Anesthesia Other Findings   Reproductive/Obstetrics                             Anesthesia Physical Anesthesia Plan  ASA: 3  Anesthesia Plan: Spinal   Post-op Pain Management: Tylenol  PO (pre-op)*   Induction:   PONV Risk Score and Plan: 1 and Treatment may vary due to age or medical condition, Midazolam, Propofol infusion, Dexamethasone  and Ondansetron   Airway Management Planned: Natural Airway  Additional Equipment:   Intra-op Plan:   Post-operative Plan:   Informed Consent: I have reviewed the patients History and Physical, chart, labs and discussed the procedure including the risks, benefits and alternatives for the proposed anesthesia with the patient or authorized representative who has indicated his/her understanding and acceptance.     Dental advisory given  Plan Discussed with: CRNA  Anesthesia Plan Comments:        Anesthesia Quick Evaluation

## 2024-03-05 ENCOUNTER — Ambulatory Visit (HOSPITAL_COMMUNITY): Payer: Self-pay | Admitting: Anesthesiology

## 2024-03-05 ENCOUNTER — Encounter (HOSPITAL_COMMUNITY)
Admission: RE | Disposition: A | Discharge status: Home / Self Care | Payer: Self-pay | Source: Ambulatory Visit | Attending: Orthopedic Surgery

## 2024-03-05 ENCOUNTER — Other Ambulatory Visit: Payer: Self-pay

## 2024-03-05 ENCOUNTER — Ambulatory Visit (HOSPITAL_COMMUNITY)

## 2024-03-05 ENCOUNTER — Inpatient Hospital Stay (HOSPITAL_COMMUNITY)
Admission: RE | Admit: 2024-03-05 | Discharge: 2024-03-09 | DRG: 470 | Disposition: A | Discharge status: Home / Self Care | Source: Ambulatory Visit | Attending: Orthopedic Surgery | Admitting: Orthopedic Surgery

## 2024-03-05 ENCOUNTER — Encounter (HOSPITAL_COMMUNITY): Payer: Self-pay | Admitting: Orthopedic Surgery

## 2024-03-05 DIAGNOSIS — I10 Essential (primary) hypertension: Secondary | ICD-10-CM

## 2024-03-05 DIAGNOSIS — R7303 Prediabetes: Secondary | ICD-10-CM | POA: Diagnosis present

## 2024-03-05 DIAGNOSIS — Z87891 Personal history of nicotine dependence: Secondary | ICD-10-CM

## 2024-03-05 DIAGNOSIS — Z01818 Encounter for other preprocedural examination: Secondary | ICD-10-CM

## 2024-03-05 DIAGNOSIS — R112 Nausea with vomiting, unspecified: Secondary | ICD-10-CM | POA: Diagnosis not present

## 2024-03-05 DIAGNOSIS — Z8249 Family history of ischemic heart disease and other diseases of the circulatory system: Secondary | ICD-10-CM

## 2024-03-05 DIAGNOSIS — M1612 Unilateral primary osteoarthritis, left hip: Secondary | ICD-10-CM

## 2024-03-05 DIAGNOSIS — J45909 Unspecified asthma, uncomplicated: Secondary | ICD-10-CM | POA: Diagnosis present

## 2024-03-05 DIAGNOSIS — I2510 Atherosclerotic heart disease of native coronary artery without angina pectoris: Secondary | ICD-10-CM | POA: Diagnosis present

## 2024-03-05 DIAGNOSIS — Z79899 Other long term (current) drug therapy: Secondary | ICD-10-CM

## 2024-03-05 DIAGNOSIS — Z880 Allergy status to penicillin: Secondary | ICD-10-CM

## 2024-03-05 DIAGNOSIS — G4730 Sleep apnea, unspecified: Secondary | ICD-10-CM | POA: Diagnosis not present

## 2024-03-05 DIAGNOSIS — Z833 Family history of diabetes mellitus: Secondary | ICD-10-CM

## 2024-03-05 DIAGNOSIS — Z7982 Long term (current) use of aspirin: Secondary | ICD-10-CM

## 2024-03-05 HISTORY — DX: Prediabetes: R73.03

## 2024-03-05 HISTORY — PX: TOTAL HIP ARTHROPLASTY: SHX124

## 2024-03-05 LAB — BASIC METABOLIC PANEL WITH GFR
Anion gap: 9 (ref 5–15)
BUN: 14 mg/dL (ref 6–20)
CO2: 23 mmol/L (ref 22–32)
Calcium: 9.2 mg/dL (ref 8.9–10.3)
Chloride: 105 mmol/L (ref 98–111)
Creatinine, Ser: 0.81 mg/dL (ref 0.61–1.24)
GFR, Estimated: >60 mL/min (ref >60)
Glucose, Bld: 103 mg/dL — ABNORMAL HIGH (ref 70–99)
Potassium: 3.2 mmol/L — ABNORMAL LOW (ref 3.5–5.1)
Sodium: 137 mmol/L (ref 135–145)

## 2024-03-05 LAB — CBC
HCT: 35.2 % — ABNORMAL LOW (ref 39.0–52.0)
Hemoglobin: 11.3 g/dL — ABNORMAL LOW (ref 13.0–17.0)
MCH: 25.2 pg — ABNORMAL LOW (ref 26.0–34.0)
MCHC: 32.1 g/dL (ref 30.0–36.0)
MCV: 78.4 fL — ABNORMAL LOW (ref 80.0–100.0)
Platelets: 240 10*3/uL (ref 150–400)
RBC: 4.49 MIL/uL (ref 4.22–5.81)
RDW: 13.2 % (ref 11.5–15.5)
WBC: 5.1 10*3/uL (ref 4.0–10.5)
nRBC: 0 % (ref 0.0–0.2)

## 2024-03-05 LAB — ABO/RH: ABO/RH(D): O POS

## 2024-03-05 SURGERY — ARTHROPLASTY, HIP, TOTAL,POSTERIOR APPROACH
Anesthesia: Spinal | Site: Hip | Laterality: Left

## 2024-03-05 MED ORDER — METHOCARBAMOL 1000 MG/10ML IJ SOLN: 500.0000 mg | Freq: Four times a day (QID) | INTRAMUSCULAR | Status: DC | PRN

## 2024-03-05 MED ORDER — PROPOFOL 500 MG/50ML IV EMUL
INTRAVENOUS | Status: DC | PRN
  Administered 2024-03-05: 100 mg via INTRAVENOUS
  Administered 2024-03-05: 20 mg via INTRAVENOUS
  Administered 2024-03-05: 50 mg via INTRAVENOUS
  Administered 2024-03-05: 100 ug/kg/min via INTRAVENOUS

## 2024-03-05 MED ORDER — LOSARTAN POTASSIUM-HCTZ 50-12.5 MG PO TABS: 1.0000 | ORAL_TABLET | Freq: Every day | ORAL | Status: DC

## 2024-03-05 MED ORDER — DEXAMETHASONE SODIUM PHOSPHATE 10 MG/ML IJ SOLN
INTRAMUSCULAR | Status: AC
  Filled 2024-03-05: qty 1

## 2024-03-05 MED ORDER — KETOROLAC TROMETHAMINE 15 MG/ML IJ SOLN
7.5000 mg | Freq: Four times a day (QID) | INTRAMUSCULAR | Status: AC
  Administered 2024-03-05 – 2024-03-06 (×4): 7.5 mg via INTRAVENOUS
  Filled 2024-03-05 (×4): qty 1

## 2024-03-05 MED ORDER — BUPIVACAINE-EPINEPHRINE (PF) 0.25% -1:200000 IJ SOLN
INTRAMUSCULAR | Status: AC
  Filled 2024-03-05: qty 30

## 2024-03-05 MED ORDER — ONDANSETRON HCL 4 MG PO TABS
4.0000 mg | ORAL_TABLET | Freq: Four times a day (QID) | ORAL | Status: DC | PRN
  Administered 2024-03-05 – 2024-03-08 (×3): 4 mg via ORAL
  Filled 2024-03-05 (×3): qty 1

## 2024-03-05 MED ORDER — ONDANSETRON HCL 4 MG/2ML IJ SOLN
INTRAMUSCULAR | Status: DC | PRN
Start: 2024-03-05 — End: 2024-03-05
  Administered 2024-03-05: 4 mg via INTRAVENOUS

## 2024-03-05 MED ORDER — PROPOFOL 10 MG/ML IV BOLUS
INTRAVENOUS | Status: AC
  Filled 2024-03-05: qty 20

## 2024-03-05 MED ORDER — LACTATED RINGERS IV BOLUS
250.0000 mL | Freq: Once | INTRAVENOUS | Status: AC
  Administered 2024-03-05: 250 mL via INTRAVENOUS

## 2024-03-05 MED ORDER — ASPIRIN 81 MG PO TBEC: 81.0000 mg | DELAYED_RELEASE_TABLET | Freq: Two times a day (BID) | ORAL | Status: AC

## 2024-03-05 MED ORDER — PROPOFOL 1000 MG/100ML IV EMUL
INTRAVENOUS | Status: AC
  Filled 2024-03-05: qty 100

## 2024-03-05 MED ORDER — VANCOMYCIN HCL IN DEXTROSE 1-5 GM/200ML-% IV SOLN
1000.0000 mg | Freq: Once | INTRAVENOUS | Status: AC
  Administered 2024-03-05: 1000 mg via INTRAVENOUS
  Filled 2024-03-05: qty 200

## 2024-03-05 MED ORDER — OXYCODONE HCL 5 MG PO TABS
5.0000 mg | ORAL_TABLET | ORAL | Status: DC | PRN
  Administered 2024-03-06 – 2024-03-09 (×13): 10 mg via ORAL
  Filled 2024-03-05 (×13): qty 2

## 2024-03-05 MED ORDER — POLYETHYLENE GLYCOL 3350 17 G PO PACK
17.0000 g | PACK | Freq: Every day | ORAL | 0 refills | Status: AC
Start: 2024-03-05 — End: ?

## 2024-03-05 MED ORDER — DIPHENHYDRAMINE HCL 12.5 MG/5ML PO ELIX: 12.5000 mg | ORAL_SOLUTION | ORAL | Status: DC | PRN

## 2024-03-05 MED ORDER — OXYCODONE HCL 5 MG PO TABS
10.0000 mg | ORAL_TABLET | Freq: Once | ORAL | Status: AC
  Administered 2024-03-05: 10 mg via ORAL

## 2024-03-05 MED ORDER — VANCOMYCIN HCL IN DEXTROSE 1-5 GM/200ML-% IV SOLN
1000.0000 mg | Freq: Two times a day (BID) | INTRAVENOUS | Status: AC
  Administered 2024-03-05: 1000 mg via INTRAVENOUS
  Filled 2024-03-05: qty 200

## 2024-03-05 MED ORDER — PHENYLEPHRINE 80 MCG/ML (10ML) SYRINGE FOR IV PUSH (FOR BLOOD PRESSURE SUPPORT)
PREFILLED_SYRINGE | INTRAVENOUS | Status: DC | PRN
Start: 2024-03-05 — End: 2024-03-05
  Administered 2024-03-05: 80 ug via INTRAVENOUS

## 2024-03-05 MED ORDER — ISOPROPYL ALCOHOL 70 % SOLN
Status: DC | PRN
  Administered 2024-03-05: 1 via TOPICAL

## 2024-03-05 MED ORDER — ASPIRIN 81 MG PO CHEW
81.0000 mg | CHEWABLE_TABLET | Freq: Two times a day (BID) | ORAL | Status: DC
  Administered 2024-03-05 – 2024-03-09 (×8): 81 mg via ORAL
  Filled 2024-03-05 (×8): qty 1

## 2024-03-05 MED ORDER — SUCCINYLCHOLINE CHLORIDE 200 MG/10ML IV SOSY
PREFILLED_SYRINGE | INTRAVENOUS | Status: DC | PRN
Start: 2024-03-05 — End: 2024-03-05
  Administered 2024-03-05: 200 mg via INTRAVENOUS

## 2024-03-05 MED ORDER — GLYCOPYRROLATE 0.2 MG/ML IJ SOLN
INTRAMUSCULAR | Status: AC
Start: 2024-03-05 — End: 2024-03-05
  Filled 2024-03-05: qty 1

## 2024-03-05 MED ORDER — MIDAZOLAM HCL 5 MG/5ML IJ SOLN
INTRAMUSCULAR | Status: DC | PRN
  Administered 2024-03-05: 2 mg via INTRAVENOUS

## 2024-03-05 MED ORDER — CIPROFLOXACIN IN D5W 400 MG/200ML IV SOLN
400.0000 mg | Freq: Three times a day (TID) | INTRAVENOUS | Status: DC
  Administered 2024-03-05: 400 mg via INTRAVENOUS
  Filled 2024-03-05: qty 200

## 2024-03-05 MED ORDER — SODIUM CHLORIDE (PF) 0.9 % IJ SOLN
INTRAMUSCULAR | Status: AC
  Filled 2024-03-05: qty 30

## 2024-03-05 MED ORDER — DIPHENHYDRAMINE HCL 50 MG/ML IJ SOLN
INTRAMUSCULAR | Status: DC | PRN
Start: 2024-03-05 — End: 2024-03-05
  Administered 2024-03-05: 12.5 mg via INTRAVENOUS

## 2024-03-05 MED ORDER — PHENOL 1.4 % MT LIQD: 1.0000 | OROMUCOSAL | Status: DC | PRN

## 2024-03-05 MED ORDER — ISOPROPYL ALCOHOL 70 % SOLN
Status: AC
  Filled 2024-03-05: qty 480

## 2024-03-05 MED ORDER — CHLORHEXIDINE GLUCONATE 0.12 % MT SOLN
15.0000 mL | Freq: Once | OROMUCOSAL | Status: AC
  Administered 2024-03-05: 15 mL via OROMUCOSAL

## 2024-03-05 MED ORDER — POLYETHYLENE GLYCOL 3350 17 G PO PACK
17.0000 g | PACK | Freq: Every day | ORAL | Status: DC | PRN
  Administered 2024-03-07 – 2024-03-08 (×2): 17 g via ORAL
  Filled 2024-03-05 (×2): qty 1

## 2024-03-05 MED ORDER — ACETAMINOPHEN 325 MG PO TABS
325.0000 mg | ORAL_TABLET | Freq: Four times a day (QID) | ORAL | Status: DC | PRN
  Administered 2024-03-06 – 2024-03-07 (×2): 650 mg via ORAL
  Filled 2024-03-05 (×2): qty 2

## 2024-03-05 MED ORDER — LACTATED RINGERS IV BOLUS
500.0000 mL | Freq: Once | INTRAVENOUS | Status: AC
  Administered 2024-03-05: 500 mL via INTRAVENOUS

## 2024-03-05 MED ORDER — SODIUM CHLORIDE 0.9 % IR SOLN
Status: DC | PRN
  Administered 2024-03-05: 1000 mL

## 2024-03-05 MED ORDER — ACETAMINOPHEN 500 MG PO TABS
1000.0000 mg | ORAL_TABLET | Freq: Once | ORAL | Status: AC
  Administered 2024-03-05: 1000 mg via ORAL

## 2024-03-05 MED ORDER — HYDROCHLOROTHIAZIDE 12.5 MG PO TABS
12.5000 mg | ORAL_TABLET | Freq: Every day | ORAL | Status: DC
  Administered 2024-03-05: 12.5 mg via ORAL
  Filled 2024-03-05: qty 1

## 2024-03-05 MED ORDER — BUPIVACAINE-EPINEPHRINE (PF) 0.5% -1:200000 IJ SOLN
INTRAMUSCULAR | Status: DC | PRN
  Administered 2024-03-05: 80 mL

## 2024-03-05 MED ORDER — HYDROMORPHONE HCL 1 MG/ML IJ SOLN
0.5000 mg | INTRAMUSCULAR | Status: DC | PRN
  Administered 2024-03-05: 1 mg via INTRAVENOUS
  Administered 2024-03-06: 0.5 mg via INTRAVENOUS
  Filled 2024-03-05 (×2): qty 1

## 2024-03-05 MED ORDER — LACTATED RINGERS IV SOLN: INTRAVENOUS | Status: DC

## 2024-03-05 MED ORDER — ACETAMINOPHEN 500 MG PO TABS
1000.0000 mg | ORAL_TABLET | Freq: Four times a day (QID) | ORAL | Status: AC
  Administered 2024-03-05 – 2024-03-06 (×4): 1000 mg via ORAL
  Filled 2024-03-05 (×4): qty 2

## 2024-03-05 MED ORDER — OXYCODONE HCL 5 MG PO TABS
ORAL_TABLET | ORAL | Status: AC
  Filled 2024-03-05: qty 2

## 2024-03-05 MED ORDER — FENTANYL CITRATE PF 50 MCG/ML IJ SOSY
PREFILLED_SYRINGE | INTRAMUSCULAR | Status: AC
  Filled 2024-03-05: qty 1

## 2024-03-05 MED ORDER — ACETAMINOPHEN 500 MG PO TABS
1000.0000 mg | ORAL_TABLET | Freq: Once | ORAL | Status: AC
  Filled 2024-03-05: qty 2

## 2024-03-05 MED ORDER — OMEPRAZOLE 40 MG PO CPDR: 40.0000 mg | DELAYED_RELEASE_CAPSULE | Freq: Every day | ORAL | 0 refills | Status: AC

## 2024-03-05 MED ORDER — SODIUM CHLORIDE 0.9 % IR SOLN: Status: DC | PRN

## 2024-03-05 MED ORDER — METHOCARBAMOL 500 MG PO TABS: 500.0000 mg | ORAL_TABLET | Freq: Three times a day (TID) | ORAL | 0 refills | Status: AC | PRN

## 2024-03-05 MED ORDER — 0.9 % SODIUM CHLORIDE (POUR BTL) OPTIME
TOPICAL | Status: DC | PRN
  Administered 2024-03-05: 1000 mL

## 2024-03-05 MED ORDER — DOCUSATE SODIUM 100 MG PO CAPS
100.0000 mg | ORAL_CAPSULE | Freq: Two times a day (BID) | ORAL | Status: DC
  Administered 2024-03-05 – 2024-03-08 (×6): 100 mg via ORAL
  Filled 2024-03-05 (×7): qty 1

## 2024-03-05 MED ORDER — DEXAMETHASONE SODIUM PHOSPHATE 10 MG/ML IJ SOLN
8.0000 mg | Freq: Once | INTRAMUSCULAR | Status: AC
Start: 2024-03-05 — End: 2024-03-05
  Administered 2024-03-05: 8 mg via INTRAVENOUS

## 2024-03-05 MED ORDER — MENTHOL 3 MG MT LOZG: 1.0000 | LOZENGE | OROMUCOSAL | Status: DC | PRN

## 2024-03-05 MED ORDER — OXYCODONE HCL 5 MG/5ML PO SOLN: 5.0000 mg | Freq: Once | ORAL | Status: DC | PRN

## 2024-03-05 MED ORDER — AMISULPRIDE (ANTIEMETIC) 5 MG/2ML IV SOLN
INTRAVENOUS | Status: AC
  Filled 2024-03-05: qty 4

## 2024-03-05 MED ORDER — POVIDONE-IODINE 10 % EX SWAB
2.0000 | Freq: Once | CUTANEOUS | Status: AC
  Administered 2024-03-05: 2 via TOPICAL

## 2024-03-05 MED ORDER — BUPIVACAINE LIPOSOME 1.3 % IJ SUSP
INTRAMUSCULAR | Status: AC
  Filled 2024-03-05: qty 20

## 2024-03-05 MED ORDER — SODIUM CHLORIDE 0.9 % IV SOLN: INTRAVENOUS | Status: DC

## 2024-03-05 MED ORDER — AMISULPRIDE (ANTIEMETIC) 5 MG/2ML IV SOLN
10.0000 mg | Freq: Once | INTRAVENOUS | Status: AC | PRN
  Administered 2024-03-05: 10 mg via INTRAVENOUS

## 2024-03-05 MED ORDER — SENNA 8.6 MG PO TABS
1.0000 | ORAL_TABLET | Freq: Two times a day (BID) | ORAL | Status: DC
  Administered 2024-03-05 – 2024-03-08 (×6): 8.6 mg via ORAL
  Filled 2024-03-05 (×7): qty 1

## 2024-03-05 MED ORDER — TRANEXAMIC ACID-NACL 1000-0.7 MG/100ML-% IV SOLN
1000.0000 mg | INTRAVENOUS | Status: AC
  Administered 2024-03-05: 1000 mg via INTRAVENOUS
  Filled 2024-03-05: qty 100

## 2024-03-05 MED ORDER — OXYCODONE HCL 5 MG PO TABS: 5.0000 mg | ORAL_TABLET | Freq: Once | ORAL | Status: DC | PRN

## 2024-03-05 MED ORDER — PHENYLEPHRINE HCL-NACL 20-0.9 MG/250ML-% IV SOLN
INTRAVENOUS | Status: AC
  Filled 2024-03-05: qty 250

## 2024-03-05 MED ORDER — FENTANYL CITRATE PF 50 MCG/ML IJ SOSY
25.0000 ug | PREFILLED_SYRINGE | INTRAMUSCULAR | Status: DC | PRN
  Administered 2024-03-05: 50 ug via INTRAVENOUS

## 2024-03-05 MED ORDER — ORAL CARE MOUTH RINSE: 15.0000 mL | Freq: Once | OROMUCOSAL | Status: AC

## 2024-03-05 MED ORDER — PANTOPRAZOLE SODIUM 40 MG PO TBEC
40.0000 mg | DELAYED_RELEASE_TABLET | Freq: Every day | ORAL | Status: DC
  Administered 2024-03-06 – 2024-03-09 (×4): 40 mg via ORAL
  Filled 2024-03-05 (×4): qty 1

## 2024-03-05 MED ORDER — ACETAMINOPHEN 500 MG PO TABS: 1000.0000 mg | ORAL_TABLET | Freq: Three times a day (TID) | ORAL | Status: AC | PRN

## 2024-03-05 MED ORDER — METHOCARBAMOL 500 MG PO TABS
500.0000 mg | ORAL_TABLET | Freq: Four times a day (QID) | ORAL | Status: DC | PRN
  Administered 2024-03-05 – 2024-03-09 (×9): 500 mg via ORAL
  Filled 2024-03-05 (×8): qty 1

## 2024-03-05 MED ORDER — ONDANSETRON HCL 4 MG/2ML IJ SOLN
INTRAMUSCULAR | Status: AC
  Filled 2024-03-05: qty 2

## 2024-03-05 MED ORDER — LOSARTAN POTASSIUM 50 MG PO TABS
50.0000 mg | ORAL_TABLET | Freq: Every day | ORAL | Status: DC
  Administered 2024-03-05: 50 mg via ORAL
  Filled 2024-03-05: qty 1

## 2024-03-05 MED ORDER — PHENYLEPHRINE 80 MCG/ML (10ML) SYRINGE FOR IV PUSH (FOR BLOOD PRESSURE SUPPORT)
PREFILLED_SYRINGE | INTRAVENOUS | Status: AC
  Filled 2024-03-05: qty 10

## 2024-03-05 MED ORDER — GLYCOPYRROLATE 0.2 MG/ML IJ SOLN
INTRAMUSCULAR | Status: DC | PRN
Start: 2024-03-05 — End: 2024-03-05
  Administered 2024-03-05: .2 mg via INTRAVENOUS

## 2024-03-05 MED ORDER — ONDANSETRON HCL 4 MG PO TABS: 4.0000 mg | ORAL_TABLET | Freq: Three times a day (TID) | ORAL | 0 refills | Status: AC | PRN

## 2024-03-05 MED ORDER — LIDOCAINE HCL (PF) 2 % IJ SOLN
INTRAMUSCULAR | Status: AC
  Filled 2024-03-05: qty 5

## 2024-03-05 MED ORDER — EPHEDRINE 5 MG/ML INJ
INTRAVENOUS | Status: AC
  Filled 2024-03-05: qty 5

## 2024-03-05 MED ORDER — METHOCARBAMOL 500 MG PO TABS
ORAL_TABLET | ORAL | Status: AC
  Filled 2024-03-05: qty 1

## 2024-03-05 MED ORDER — ONDANSETRON HCL 4 MG/2ML IJ SOLN
4.0000 mg | Freq: Four times a day (QID) | INTRAMUSCULAR | Status: DC | PRN
  Administered 2024-03-08: 4 mg via INTRAVENOUS
  Filled 2024-03-05: qty 2

## 2024-03-05 MED ORDER — DIPHENHYDRAMINE HCL 50 MG/ML IJ SOLN
INTRAMUSCULAR | Status: AC
  Filled 2024-03-05: qty 1

## 2024-03-05 MED ORDER — BUPIVACAINE LIPOSOME 1.3 % IJ SUSP: 10.0000 mL | Freq: Once | INTRAMUSCULAR | Status: DC

## 2024-03-05 MED ORDER — MIDAZOLAM HCL 2 MG/2ML IJ SOLN
INTRAMUSCULAR | Status: AC
  Filled 2024-03-05: qty 2

## 2024-03-05 MED ORDER — CELECOXIB 100 MG PO CAPS: 100.0000 mg | ORAL_CAPSULE | Freq: Two times a day (BID) | ORAL | 0 refills | Status: AC

## 2024-03-05 MED ORDER — OXYCODONE HCL 5 MG PO TABS: 5.0000 mg | ORAL_TABLET | ORAL | 0 refills | Status: AC | PRN

## 2024-03-05 MED ORDER — BUPIVACAINE IN DEXTROSE 0.75-8.25 % IT SOLN
INTRATHECAL | Status: DC | PRN
  Administered 2024-03-05: 1.8 mL via INTRATHECAL

## 2024-03-05 SURGICAL SUPPLY — 63 items
BAG COUNTER SPONGE SURGICOUNT (BAG) IMPLANT
BAG ZIPLOCK 12X15 (MISCELLANEOUS) ×1 IMPLANT
BIT DRILL TRIDENT 4X40 SU (BIT) IMPLANT
BLADE SAW SAG 25X90X1.19 (BLADE) ×1 IMPLANT
CHLORAPREP W/TINT 26 (MISCELLANEOUS) ×2 IMPLANT
CNTNR URN SCR LID CUP LEK RST (MISCELLANEOUS) ×1 IMPLANT
COVER SURGICAL LIGHT HANDLE (MISCELLANEOUS) ×1 IMPLANT
DERMABOND ADVANCED .7 DNX12 (GAUZE/BANDAGES/DRESSINGS) ×2 IMPLANT
DRAPE HIP W/POCKET STRL (MISCELLANEOUS) ×1 IMPLANT
DRAPE INCISE IOBAN 85X60 (DRAPES) ×2 IMPLANT
DRAPE POUCH INSTRU U-SHP 10X18 (DRAPES) ×1 IMPLANT
DRAPE SHEET LG 3/4 BI-LAMINATE (DRAPES) ×3 IMPLANT
DRAPE U-SHAPE 47X51 STRL (DRAPES) ×2 IMPLANT
DRSG AQUACEL AG ADV 3.5X10 (GAUZE/BANDAGES/DRESSINGS) ×1 IMPLANT
ELECT BLADE TIP CTD 4 INCH (ELECTRODE) ×1 IMPLANT
ELECT REM PT RETURN 15FT ADLT (MISCELLANEOUS) ×1 IMPLANT
GAUZE SPONGE 4X4 12PLY STRL (GAUZE/BANDAGES/DRESSINGS) ×1 IMPLANT
GLOVE BIO SURGEON STRL SZ 6.5 (GLOVE) ×2 IMPLANT
GLOVE BIOGEL PI IND STRL 6.5 (GLOVE) ×1 IMPLANT
GLOVE BIOGEL PI IND STRL 8 (GLOVE) ×1 IMPLANT
GLOVE SURG ORTHO 8.0 STRL STRW (GLOVE) ×2 IMPLANT
GOWN STRL REUS W/ TWL XL LVL3 (GOWN DISPOSABLE) ×2 IMPLANT
HEAD CERAMIC FEMORAL 36MM (Head) IMPLANT
HOLDER FOLEY CATH W/STRAP (MISCELLANEOUS) ×1 IMPLANT
HOOD PEEL AWAY T7 (MISCELLANEOUS) ×3 IMPLANT
INSERT 0 DEG POLY 36 F (Miscellaneous) IMPLANT
KIT BASIN OR (CUSTOM PROCEDURE TRAY) ×1 IMPLANT
KIT TURNOVER KIT A (KITS) ×1 IMPLANT
MANIFOLD NEPTUNE II (INSTRUMENTS) ×1 IMPLANT
MARKER SKIN DUAL TIP RULER LAB (MISCELLANEOUS) ×1 IMPLANT
NDL SAFETY ECLIPSE 18X1.5 (NEEDLE) ×2 IMPLANT
NS IRRIG 1000ML POUR BTL (IV SOLUTION) ×1 IMPLANT
PACK TOTAL JOINT (CUSTOM PROCEDURE TRAY) ×1 IMPLANT
PAD ARMBOARD POSITIONER FOAM (MISCELLANEOUS) ×1 IMPLANT
PENCIL SMOKE EVACUATOR (MISCELLANEOUS) IMPLANT
PROTECTOR NERVE ULNAR (MISCELLANEOUS) IMPLANT
RETRIEVER SUT HEWSON (MISCELLANEOUS) ×1 IMPLANT
SCREW HEX LP 6.5X25 (Screw) IMPLANT
SCREW HEX LP 6.5X30 (Screw) IMPLANT
SEALER BIPOLAR AQUA 6.0 (INSTRUMENTS) IMPLANT
SET HNDPC FAN SPRY TIP SCT (DISPOSABLE) IMPLANT
SHELL TRIDENT II CLUST SZ 56MM (Shell) IMPLANT
SHIELD FACE FULL FLUID (MISCELLANEOUS) ×1 IMPLANT
SOLUTION IRRIG SURGIPHOR (IV SOLUTION) IMPLANT
SOLUTION PRONTOSAN WOUND 350ML (IRRIGATION / IRRIGATOR) IMPLANT
SPIKE FLUID TRANSFER (MISCELLANEOUS) ×1 IMPLANT
STEM STD OFFSET SZ7 38 (Stem) IMPLANT
SUCTION TUBE FRAZIER 12FR DISP (SUCTIONS) ×1 IMPLANT
SUT BONE WAX W31G (SUTURE) ×1 IMPLANT
SUT ETHIBOND #5 BRAIDED 30INL (SUTURE) ×1 IMPLANT
SUT MNCRL AB 3-0 PS2 18 (SUTURE) ×1 IMPLANT
SUT STRATAFIX 14 PDO 48 VLT (SUTURE) ×1 IMPLANT
SUT VIC AB 0 CT1 36 (SUTURE) IMPLANT
SUT VIC AB 2-0 CT2 27 (SUTURE) ×2 IMPLANT
SUTURE STRATFX 0 PDS 27 VIOLET (SUTURE) ×1 IMPLANT
SYR 30ML LL (SYRINGE) ×1 IMPLANT
SYR 50ML LL SCALE MARK (SYRINGE) ×1 IMPLANT
TOWEL OR 17X26 10 PK STRL BLUE (TOWEL DISPOSABLE) ×1 IMPLANT
TOWER CARTRIDGE SMART MIX (DISPOSABLE) IMPLANT
TRAY FOLEY MTR SLVR 16FR STAT (SET/KITS/TRAYS/PACK) IMPLANT
TUBE SUCTION HIGH CAP CLEAR NV (SUCTIONS) ×1 IMPLANT
UNDERPAD 30X36 HEAVY ABSORB (UNDERPADS AND DIAPERS) ×1 IMPLANT
WATER STERILE IRR 1000ML POUR (IV SOLUTION) ×2 IMPLANT

## 2024-03-05 NOTE — Evaluation (Signed)
 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 Pt is currently LLE WBAT and no formal hip precautions. Pt PMH includes but is not limited to: OSA, HTN, asthma, and L hip surgery.  Clinical Impression   Justin Walls is a 48 y.o. male POD 0 s/p L THA. Patient reports IND with mobility at baseline. Patient is now limited by functional impairments (see PT problem list below) and requires min to mod A  for bed mobility and mod A for sit to stand  and mod A x 2  for transfers. Pt required progressively increased assist for sitting EOB from S to mod A. Pt limited in evaluation today due to pain, nausea and elevated Bp. Patient instruction initiated in supine exercise to facilitate ROM and circulation to manage edema.  Patient will benefit from continued skilled PT interventions to address impairments and progress towards PLOF. Acute PT will follow to progress mobility in preparation for safe discharge home with family support and OPPT services.       If plan is discharge home, recommend the following: A little help with walking and/or transfers;A little help with bathing/dressing/bathroom;Assistance with cooking/housework;Assist for transportation;Help with stairs or ramp for entrance   Can travel by private vehicle        Equipment Recommendations Rolling walker (2 wheels)  Recommendations for Other Services       Functional Status Assessment Patient has had a recent decline in their functional status and demonstrates the ability to make significant improvements in function in a reasonable and predictable amount of time.     Precautions / Restrictions Precautions Precautions: Fall Restrictions Weight Bearing Restrictions Per Provider  Order: No      Mobility  Bed Mobility Overal bed mobility: Needs Assistance Bed Mobility: Supine to Sit, Sit to Supine     Supine to sit: Min assist, HOB elevated, Used rails Sit to supine: Mod assist   General bed mobility comments: supine to sit from stretcher with min A to manage L LE to EOB, mod A for B LE for sit to supine in hospital bed    Transfers Overall transfer level: Needs assistance Equipment used: Rolling walker (2 wheels) Transfers: Sit to/from Stand, Bed to chair/wheelchair/BSC Sit to Stand: Mod assist Stand pivot transfers: Mod assist, +2 physical assistance, +2 safety/equipment, From elevated surface         General transfer comment: pt indicated having waves of nausea and PT noted elevated Bp 168/101 at rest with pt reporting normaly takes Bp medication in am, due to prior B hip pinning surgery`and B hip IR pt having to accomodate for new proper anatomical alignment of L LE requiring mod A for initial standing from EOB with pull to stand at Rw, while standing and performing weight shifting task pt reported need to void bladder, episode of voiding resulting in gown and socks being soild pt directed to sit EOB and from that point on pt reported nausea, presented with B UE involuntary like jerking UE movements, lethargic as noted with minimal verbal communication and pt inabiltiy to maintain grasp on RW nor cup when pt requested water, antinausea medication via IV provided by nursing, Bp obtained and elevated substanitally 182/148 nursing and Dr. Edna aware. PT deemed pt inappropriate for same day  d/c and PT able to assist nursing staff to transfer pt from stretcher to standard hospital bed with mod A x 2 and subsequent A x 4 to reposition in hospital bed.    Ambulation/Gait                  Stairs            Wheelchair Mobility     Tilt Bed    Modified Rankin (Stroke Patients Only)       Balance Overall balance assessment: Needs  assistance Sitting-balance support: Feet supported, Bilateral upper extremity supported Sitting balance-Leahy Scale: Fair     Standing balance support: Bilateral upper extremity supported, During functional activity, Reliant on assistive device for balance Standing balance-Leahy Scale: Poor                               Pertinent Vitals/Pain Pain Assessment Pain Assessment: 0-10 Pain Score: 8  Pain Location: L hip and LE and R LE 6/10 Pain Descriptors / Indicators: Aching, Constant, Discomfort, Dull, Grimacing, Operative site guarding Pain Intervention(s): Limited activity within patient's tolerance, Monitored during session, Premedicated before session, Repositioned, Ice applied    Home Living Family/patient expects to be discharged to:: Private residence Living Arrangements: Spouse/significant other;Children Available Help at Discharge: Family Type of Home: House Home Access: Level entry       Home Layout: One level Home Equipment: None      Prior Function Prior Level of Function : Independent/Modified Independent;Needs assist       Physical Assist : ADLs (physical)   ADLs (physical): Dressing Mobility Comments: IND no AD for all mobility tasks, pt reports difficulty with steps PLOF       Extremity/Trunk Assessment        Lower Extremity Assessment Lower Extremity Assessment: LLE deficits/detail LLE Deficits / Details: ankle DF/PF 5/5 LLE Sensation: decreased light touch    Cervical / Trunk Assessment Cervical / Trunk Assessment: Normal  Communication   Communication Communication: No apparent difficulties    Cognition Arousal: Alert Behavior During Therapy: WFL for tasks assessed/performed   PT - Cognitive impairments: No apparent impairments                         Following commands: Intact       Cueing       General Comments      Exercises Total Joint Exercises Ankle Circles/Pumps: AROM, Both, 10 reps Quad Sets:  AROM, Left, 5 reps Heel Slides: AROM, Left, 5 reps   Assessment/Plan    PT Assessment Patient needs continued PT services  PT Problem List Decreased range of motion;Decreased strength;Decreased activity tolerance;Decreased balance;Decreased mobility;Decreased coordination;Pain       PT Treatment Interventions DME instruction;Gait training;Stair training;Functional mobility training;Therapeutic activities;Therapeutic exercise;Balance training;Neuromuscular re-education;Patient/family education;Modalities    PT Goals (Current goals can be found in the Care Plan section)  Acute Rehab PT Goals Patient Stated Goal: to be able to move better, pain free on L side and be able to have the R THA done PT Goal Formulation: With patient Time For Goal Achievement: 03/19/24 Potential to Achieve Goals: Good    Frequency 7X/week     Co-evaluation               AM-PAC PT 6 Clicks Mobility  Outcome Measure Help needed turning from your back to your side while in a flat bed without using bedrails?:  A Little Help needed moving from lying on your back to sitting on the side of a flat bed without using bedrails?: A Little Help needed moving to and from a bed to a chair (including a wheelchair)?: A Lot Help needed standing up from a chair using your arms (e.g., wheelchair or bedside chair)?: A Lot Help needed to walk in hospital room?: Total Help needed climbing 3-5 steps with a railing? : Total 6 Click Score: 12    End of Session Equipment Utilized During Treatment: Gait belt Activity Tolerance: Patient limited by pain;Patient limited by lethargy;Treatment limited secondary to medical complications (Comment) (Bp and nausea) Patient left: in bed;with call bell/phone within reach;with nursing/sitter in room;with family/visitor present Nurse Communication: Mobility status;Other (comment) (pt not ready for safe same day d/c) PT Visit Diagnosis: Unsteadiness on feet (R26.81);Other abnormalities  of gait and mobility (R26.89);Muscle weakness (generalized) (M62.81);Difficulty in walking, not elsewhere classified (R26.2);Pain Pain - Right/Left: Left Pain - part of body: Leg;Hip    Time: 1531-1640 PT Time Calculation (min) (ACUTE ONLY): 69 min   Charges:   PT Evaluation $PT Eval Moderate Complexity: 1 Mod PT Treatments $Therapeutic Exercise: 8-22 mins $Therapeutic Activity: 38-52 mins PT General Charges $$ ACUTE PT VISIT: 1 Visit         [place], PT Acute Rehab   [place] VEAR Drone 03/05/2024, 5:03 PM

## 2024-03-05 NOTE — Discharge Instructions (Signed)

## 2024-03-05 NOTE — Plan of Care (Signed)
   Problem: Coping: Goal: Level of anxiety will decrease Outcome: Progressing   Problem: Pain Managment: Goal: General experience of comfort will improve and/or be controlled Outcome: Progressing   Problem: Safety: Goal: Ability to remain free from injury will improve Outcome: Progressing

## 2024-03-05 NOTE — Progress Notes (Signed)
 Patient up with PT, nauseated and increased pain/ Dr Edna aware and also that his BP has been running high. Orders to keep tonight . See PT note

## 2024-03-05 NOTE — Anesthesia Procedure Notes (Addendum)
 Spinal  Patient location during procedure: OR Start time: 03/05/2024 7:37 AM End time: 03/05/2024 7:40 AM Reason for block: surgical anesthesia Staffing Performed: anesthesiologist  Anesthesiologist: Niels Marien CROME, MD Performed by: Niels Marien CROME, MD Authorized by: Niels Marien CROME, MD   Preanesthetic Checklist Completed: patient identified, IV checked, risks and benefits discussed, surgical consent, monitors and equipment checked, pre-op evaluation and timeout performed Spinal Block Patient position: sitting Prep: DuraPrep and site prepped and draped Patient monitoring: cardiac monitor, continuous pulse ox and blood pressure Approach: midline Location: L3-4 Injection technique: single-shot Needle Needle type: Pencan  Needle gauge: 24 G Needle length: 9 cm Assessment Sensory level: T6 Events: CSF return Additional Notes Functioning IV was confirmed and monitors were applied. Sterile prep and drape, including hand hygiene and sterile gloves were used. The patient was positioned and the spine was prepped. The skin was anesthetized with lidocaine .  Free flow of clear CSF was obtained prior to injecting local anesthetic into the CSF.  The spinal needle aspirated freely following injection.  The needle was carefully withdrawn.  The patient tolerated the procedure well.

## 2024-03-05 NOTE — Transfer of Care (Signed)
 Immediate Anesthesia Transfer of Care Note  Patient: Justin Walls  Procedure(s) Performed: ARTHROPLASTY, HIP, TOTAL,POSTERIOR APPROACH (Left: Hip)  Patient Location: PACU  Anesthesia Type:MAC, General, and Spinal  Level of Consciousness: awake, drowsy, and patient cooperative  Airway & Oxygen Therapy: Patient Spontanous Breathing and Patient connected to face mask oxygen  Post-op Assessment: Report given to RN and Post -op Vital signs reviewed and stable  Post vital signs: Reviewed and stable  Last Vitals:  Vitals Value Taken Time  BP 130/91 03/05/24 09:54        03/05/24 09:55  Resp 19 03/05/24 09:57  SpO2 100 % 03/05/24 09:55  Vitals shown include unfiled device data.  Last Pain:  Vitals:   03/05/24 0634  TempSrc:   PainSc: 6       Patients Stated Pain Goal: 4 (03/05/24 9371)  Complications: No notable events documented.

## 2024-03-05 NOTE — Anesthesia Procedure Notes (Signed)
 Procedure Name: MAC Date/Time: 03/05/2024 7:49 AM  Performed by: Nada Corean CROME, CRNAPre-anesthesia Checklist: Patient identified, Emergency Drugs available, Suction available, Patient being monitored and Timeout performed Patient Re-evaluated:Patient Re-evaluated prior to induction Oxygen Delivery Method: Simple face mask Preoxygenation: Pre-oxygenation with 100% oxygen Induction Type: IV induction Placement Confirmation: positive ETCO2 Dental Injury: Teeth and Oropharynx as per pre-operative assessment

## 2024-03-05 NOTE — Anesthesia Procedure Notes (Signed)
 Procedure Name: Intubation Date/Time: 03/05/2024 9:11 AM  Performed by: Nada Corean CROME, CRNAPre-anesthesia Checklist: Emergency Drugs available, Suction available and Patient being monitored Oxygen Delivery Method: Circle system utilized Preoxygenation: Pre-oxygenation with 100% oxygen Induction Type: IV induction Laryngoscope Size: Glidescope and 4 Grade View: Grade II Tube type: Oral Tube size: 7.0 mm Number of attempts: 1 Airway Equipment and Method: Stylet and Video-laryngoscopy Placement Confirmation: ETT inserted through vocal cords under direct vision, positive ETCO2 and breath sounds checked- equal and bilateral Secured at: 22 cm Tube secured with: Tape Dental Injury: Teeth and Oropharynx as per pre-operative assessment  Comments: MD called to bedside due to pt wretching/vomiting and desaturaion. Suctioned. Circle system face mask applied with jaw thrust and subsequent successful RSI while pt remains in right lateral position.  VSS, lungs clear.

## 2024-03-05 NOTE — Anesthesia Postprocedure Evaluation (Signed)
 Anesthesia Post Note  Patient: Justin Walls  Procedure(s) Performed: ARTHROPLASTY, HIP, TOTAL,POSTERIOR APPROACH (Left: Hip)     Patient location during evaluation: PACU Anesthesia Type: Spinal Level of consciousness: oriented and awake and alert Pain management: pain level controlled Vital Signs Assessment: post-procedure vital signs reviewed and stable Respiratory status: spontaneous breathing, respiratory function stable and patient connected to nasal cannula oxygen Cardiovascular status: blood pressure returned to baseline and stable Postop Assessment: no headache, no backache and no apparent nausea or vomiting Anesthetic complications: no  No notable events documented.  Last Vitals:  Vitals:   03/05/24 1315 03/05/24 1348  BP: (!) 147/95 (!) 156/98  Pulse: 91 79  Resp: 18 20  Temp:  (!) 36.4 C  SpO2: 99% 99%    Last Pain:  Vitals:   03/05/24 1348  TempSrc: Oral  PainSc:                  [name]

## 2024-03-05 NOTE — Interval H&P Note (Signed)
 The patient has been re-examined, and the chart reviewed, and there have been no interval changes to the documented history and physical.    Plan for Left hip THA for left hip OA  The operative side was examined and the patient was confirmed to have sensation to DPN, SPN, TN intact, Motor EHL, ext, flex 5/5, and DP 2+, PT 2+, No significant edema.   The risks, benefits, and alternatives have been discussed at length with patient, and the patient is willing to proceed.  Left hip marked. Consent has been signed.

## 2024-03-05 NOTE — Op Note (Signed)
 03/05/2024  9:40 AM  PATIENT:  Justin Walls   MRN: [id number]  PRE-OPERATIVE DIAGNOSIS: End-stage left hip posttraumatic osteoarthritis  POST-OPERATIVE DIAGNOSIS:  same  PROCEDURE: Conversion to left total hip arthroplasty from prior slipped capital femoral epiphysis fracture treated with cannulated screw with subsequent hardware removal   PREOPERATIVE INDICATIONS:    Justin Walls is an 48 y.o. male who has a diagnosis of end-stage left hip posttraumatic arthritis.  He has a history of bilateral's slipped capital femoral epiphysis treated with screw fixation and hardware removal although his fractures did heal he developed severe deformity and progressively worsening arthritis resulting in severe stiffness and pain in both hips and elected for surgical management after failing conservative treatment.  The risks benefits and alternatives were discussed with the patient including but not limited to the risks of nonoperative treatment, versus surgical intervention including infection, bleeding, nerve injury, periprosthetic fracture, the need for revision surgery, dislocation, leg length discrepancy, blood clots, cardiopulmonary complications, morbidity, mortality, among others, and they were willing to proceed.     OPERATIVE REPORT     SURGEON:  Toribio Higashi, MD    ASSISTANT: Bernarda Mclean, PA-C, (Present throughout the entire procedure,  necessary for completion of procedure in a timely manner, assisting with retraction, instrumentation, and closure)     ANESTHESIA: Spinal  ESTIMATED BLOOD LOSS: 300cc    COMPLICATIONS:  None.     UNIQUE ASPECTS OF THE CASE: Patient developed some nausea and retching near the end of the surgery ultimately intubated and converted to a general.  Lungs remained clear no concerns at that time of aspiration per anesthesia.  COMPONENTS:   Stryker Trident 2 56 mm acetabular shell, 6.5 hex screws x 2, neutral alpha code F Trident X3 polyethylene liner,  insignia size 7 stem standard offset, 36+0 ceramic head ball  Implant Name Type Inv. Item Serial No. Manufacturer Lot No. LRB No. Used Action  SHELL TRIDENT II CLUST SZ - [id number] Shell SHELL TRIDENT II CLUST SZ  [employer] [id number] Left 1 Implanted  INSERT 0 DEG POLY 36 F - [id number] Miscellaneous INSERT 0 DEG POLY 36 F  [employer] [id number] Left 1 Implanted  SCREW HEX LP 6.5X25 - [id number] Screw SCREW HEX LP 6.5X25  [employer] [id number] Left 1 Implanted  SCREW HEX LP 6.5X30 - [id number] Screw SCREW HEX LP 6.5X30  [employer] [id number] Left 1 Implanted  STEM STD OFFSET SZ7 38 - [id number] Stem STEM STD OFFSET SZ7 38  [employer] [id number] Left 1 Implanted  HEAD CERAMIC FEMORAL - [id number] Head HEAD CERAMIC FEMORAL  [employer] [id number] Left 1 Implanted       PROCEDURE IN DETAIL:   The patient was met in the holding area and  identified.  The appropriate hip was identified and marked at the operative site.  The patient was then transported to the OR  and  placed under anesthesia.  At that point, the patient was  placed in the lateral decubitus position with the operative side up and  secured to the operating room table  and all bony prominences padded. A subaxillary role was also placed.    The operative lower extremity was prepped from the iliac crest to the distal leg.  Sterile draping was performed.  Preoperative antibiotics, 1 g of vancomycin and 400 mg of ciprofloxacin,1 gm of Tranexamic Acid, and 8 mg of Decadron  administered. Time out was performed prior to incision.      The old incision  from his prior surgery was 2 anterior and distal so a new incision was made with an adequate skin bridge. A routine posterolateral approach was utilized via sharp dissection  carried down to the subcutaneous tissue.    Gross bleeders were Bovie coagulated.  The iliotibial band was identified and incised along the length of the skin incision  through the glute max fascia.  Charnley retractor was placed with care to protect the sciatic nerve posteriorly.  With the hip internally rotated, the piriformis tendon was identified and released from the femoral insertion and tagged with a #5 Ethibond.  A capsulotomy was then performed off the femoral insertion and also tagged with a #5 Ethibond.    The femoral neck was exposed, and I resected the femoral neck based on preoperative templating relative to the lesser trochanter.    I then exposed the deep acetabulum, cleared out any tissue including the ligamentum teres.  After adequate visualization, I excised the labrum.  I then started reaming with a 52 mm reamer, first medializing to the floor of the cotyloid fossa, and then in the position of the cup aiming towards the greater sciatic notch, matching the version of the transverse acetabular ligament and tucked under the anterior wall. I reamed up to 56 mm reamer with good bony bed preparation and a 56 mm cup was chosen.  The real cup was then impacted into place.  Appropriate version and inclination was confirmed clinically matching their bony anatomy, and also with the use of the jig.  I placed 2 screws in the posterior superior quadrant to augment fixation.  A neutral liner was placed and impacted. It was confirmed to be appropriately seated and the acetabular retractors were removed.    I then prepared the proximal femur using the box cutter, Charnley awl, and then sequentially broached starting with 0 up to a size 5.  A trial broach, neck, and head was utilized, and I reduced the hip and it was found to have excellent stability.  There was no impingement with full extension and 90 degrees external rotation.  The hip was stable at the position of sleep and with 90 degrees flexion and 80 degrees of internal rotation.  Leg lengths were also clinically assessed in the lateral position and felt to be equal. Intra-Op flatplate was obtained and confirmed  appropriate component positions.  Good restoration of leg length and offset. No evidence or concern for fracture.  The size 5 stem looked a little bit undersized elected to broach up to the size 7 stem.  This was again trialed and had excellent stability.  A final femoral prosthesis size 7 was selected. I then impacted the real femoral prosthesis into place.I again trialed and selected a 36+ 0mm ball. The hip was then reduced and taken through a range of motion. There was no impingement with full extension and 90 degrees external rotation.  The hip was stable at the position of sleep and with 90 degrees flexion and 80 degrees of internal rotation. Leg lengths were  again assessed and felt to be restored.  We then opened, and I impacted the real head ball into place.  The posterior capsule was then closed with #5 Ethibond.  The piriformis was repaired through the base of the abductor tendon using a [place] suture passer.  I then irrigated the hip copiously with dilute Betadine and with normal saline pulse lavage. Periarticular injection was then performed with Exparel.   We repaired the fascia #1 barbed suture,  followed by 0 barbed suture for the subcutaneous fat.  Skin was closed with 2-0 Vicryl and 3-0 Monocryl.  Dermabond and Aquacel dressing were applied. The patient was then awakened and returned to PACU in stable and satisfactory condition.  Leg lengths in the supine position were assessed and felt to be clinically equal. There were no complications.  Post op recs: WB: WBAT LLE, No formal hip precautions Abx: Vanco and Cipro given history of amoxicillin anaphylaxis Imaging: PACU pelvis Xray Dressing: Aquacell, keep intact until follow up DVT prophylaxis: Aspirin  81BID starting POD1 Follow up: 2 weeks after surgery for a wound check with Dr. Edna at [facility].  Address: [address], [place], [place] [address]  Office Phone: [phone]   Toribio Edna,  MD Orthopedic Surgeon

## 2024-03-05 NOTE — Progress Notes (Signed)
 Ankle monitor noted on R ankle upon arrival

## 2024-03-06 ENCOUNTER — Encounter (HOSPITAL_COMMUNITY): Payer: Self-pay | Admitting: Orthopedic Surgery

## 2024-03-06 LAB — CBC
HCT: 27.5 % — ABNORMAL LOW (ref 39.0–52.0)
Hemoglobin: 8.9 g/dL — ABNORMAL LOW (ref 13.0–17.0)
MCH: 25.2 pg — ABNORMAL LOW (ref 26.0–34.0)
MCHC: 32.4 g/dL (ref 30.0–36.0)
MCV: 77.9 fL — ABNORMAL LOW (ref 80.0–100.0)
Platelets: 186 10*3/uL (ref 150–400)
RBC: 3.53 MIL/uL — ABNORMAL LOW (ref 4.22–5.81)
RDW: 13.1 % (ref 11.5–15.5)
WBC: 10.3 10*3/uL (ref 4.0–10.5)
nRBC: 0 % (ref 0.0–0.2)

## 2024-03-06 LAB — BASIC METABOLIC PANEL WITH GFR
Anion gap: 9 (ref 5–15)
BUN: 13 mg/dL (ref 6–20)
CO2: 24 mmol/L (ref 22–32)
Calcium: 9.1 mg/dL (ref 8.9–10.3)
Chloride: 104 mmol/L (ref 98–111)
Creatinine, Ser: 0.95 mg/dL (ref 0.61–1.24)
GFR, Estimated: >60 mL/min (ref >60)
Glucose, Bld: 132 mg/dL — ABNORMAL HIGH (ref 70–99)
Potassium: 3.5 mmol/L (ref 3.5–5.1)
Sodium: 137 mmol/L (ref 135–145)

## 2024-03-06 MED ORDER — LOSARTAN POTASSIUM 50 MG PO TABS
50.0000 mg | ORAL_TABLET | Freq: Every day | ORAL | Status: DC
  Administered 2024-03-06 – 2024-03-09 (×4): 50 mg via ORAL
  Filled 2024-03-06 (×4): qty 1

## 2024-03-06 MED ORDER — HYDROCHLOROTHIAZIDE 12.5 MG PO TABS
12.5000 mg | ORAL_TABLET | Freq: Every day | ORAL | Status: DC
  Administered 2024-03-06 – 2024-03-09 (×4): 12.5 mg via ORAL
  Filled 2024-03-06 (×4): qty 1

## 2024-03-06 NOTE — TOC Transition Note (Signed)
 Transition of Care [facility]) - Discharge Note   Patient Details  Name: Justin Walls MRN: [id number] Date of Birth: 11/20/1975  [facility] CM/SW Contact:  NORMAN ASPEN, LCSW Phone Number: 03/06/2024, 9:13 AM   Clinical Narrative:     Met with pt and confirming he has received RW to room via [employer].  OPPT already arranged.  No further TOC needs.  Final next level of care: OP Rehab Barriers to Discharge: No Barriers Identified   Patient Goals and CMS Choice Patient states their goals for this hospitalization and ongoing recovery are:: return home          Discharge Placement                       Discharge Plan and Services Additional resources added to the After Visit Summary for                  DME Arranged: Walker rolling DME Agency: [employer]                  Social Drivers of Health (SDOH) Interventions SDOH Screenings   Food Insecurity: No Food Insecurity (03/05/2024)  Housing: Low Risk  (03/05/2024)  Transportation Needs: No Transportation Needs (03/05/2024)  Utilities: Not At Risk (03/05/2024)  Depression (PHQ2-9): Low Risk  (12/19/2023)  Tobacco Use: Medium Risk (03/05/2024)     Readmission Risk Interventions     No data to display

## 2024-03-06 NOTE — Progress Notes (Signed)
     Subjective:  Patient reports that he is doing much better this morning.  Had issues postop with pain control, nausea, high blood pressure necessitating overnight stay.  He is doing much better with all of these this morning.  No concerns.  Plan for additional therapy this morning and hopeful for discharge home later today.  Denies distal numbness and tingling.   Objective:   VITALS:   Vitals:   03/05/24 1940 03/05/24 2149 03/06/24 0137 03/06/24 0513  BP: (!) 179/94 (!) 141/78 127/77 109/60  Pulse: 69 94 69 83  Resp: 18 18 17 17   Temp: 99 F (37.2 C) 99 F (37.2 C) 98.2 F (36.8 C) 98.9 F (37.2 C)  TempSrc: Oral Oral Axillary   SpO2: 100% 100% 100% 100%  Weight:      Height:        Neurovascular intact Sensation intact distally Intact pulses distally Dorsiflexion/Plantar flexion intact Incision: dressing C/D/I    Lab Results  Component Value Date   WBC 10.3 03/06/2024   HGB 8.9 (L) 03/06/2024   HCT 27.5 (L) 03/06/2024   MCV 77.9 (L) 03/06/2024   PLT 186 03/06/2024   BMET    Component Value Date/Time   NA 137 03/06/2024 0317   NA 139 12/19/2023 1031   K 3.5 03/06/2024 0317   CL 104 03/06/2024 0317   CO2 24 03/06/2024 0317   GLUCOSE 132 (H) 03/06/2024 0317   BUN 13 03/06/2024 0317   BUN 11 12/19/2023 1031   CREATININE 0.95 03/06/2024 0317   CALCIUM 9.1 03/06/2024 0317   EGFR 100 12/19/2023 1031   GFRNONAA >60 03/06/2024 0317      Xray: Left total hip arthroplasty components in good position no adverse features  Assessment/Plan: 1 Day Post-Op   Principal Problem:   Osteoarthritis of left hip  Post op recs: WB: WBAT LLE, No formal hip precautions Abx: Vanco and Cipro given history of amoxicillin anaphylaxis Imaging: PACU pelvis Xray Dressing: Aquacell, keep intact until follow up DVT prophylaxis: Aspirin  81BID starting POD1 Follow up: 2 weeks after surgery for a wound check with Dr. Edna at [facility].  Address: [address], [place], [place] [address]  Office Phone: [phone]     TORIBIO DELENA EDNA 03/06/2024, 6:39 AM   TORIBIO Edna, MD  Contact information:   [phone] 7am-5pm epic message Dr. Edna, or call office for patient follow up: [phone] After hours and holidays please check [email or web address] for group call information for [facility]

## 2024-03-06 NOTE — Progress Notes (Addendum)
 Physical Therapy Treatment Patient Details Name: Justin Walls MRN: [id number] DOB: Nov 13, 1975 Today's Date: 03/06/2024   History of Present Illness 48 yo male presents to therapy s/p conversion to L THA from slipped capital femoral epiphysis fx and ORIF on 03/05/2024 due to failure of conservative measures. Pt is currently LLE WBAT and no formal hip precautions. Pt PMH includes but is not limited to: OSA, HTN, asthma, and L hip surgery.    PT Comments  Pt requiring increased time and cues for technique as able.  Pt with previous history of chronic bil hip pain (per his report from age 19).  Pt reports limitations with hip mobility prior to surgery as well.  Pt assisted with standing and ambulating in hallway.  Upon return to recliner, pt reporting mild dizziness and nausea.  Vitals obtained: 125/81 mmHg, 97 bpm.  RN into room with medications and notified.     If plan is discharge home, recommend the following: A little help with walking and/or transfers;A little help with bathing/dressing/bathroom;Assistance with cooking/housework;Assist for transportation;Help with stairs or ramp for entrance   Can travel by private vehicle        Equipment Recommendations  Rolling walker (2 wheels)    Recommendations for Other Services       Precautions / Restrictions Precautions Precautions: Fall Restrictions LLE Weight Bearing Per Provider Order: Weight bearing as tolerated     Mobility  Bed Mobility Overal bed mobility: Needs Assistance Bed Mobility: Supine to Sit     Supine to sit: Contact guard     General bed mobility comments: verbal cues for self assist with gait belt    Transfers Overall transfer level: Needs assistance Equipment used: Rolling walker (2 wheels) Transfers: Sit to/from Stand Sit to Stand: Min assist           General transfer comment: verbal cues for positioning and technique, pt tends to keep R hip in increased external rotation to initiate stand; cues  for heavy use of UEs to self assist (history of chronic bil hip pain); min assist for transition of hands from bed to RW and then to recliner    Ambulation/Gait Ambulation/Gait assistance: Contact guard assist Gait Distance (Feet): 80 Feet Assistive device: Rolling walker (2 wheels) Gait Pattern/deviations: Step-to pattern, Antalgic, Decreased stance time - left Gait velocity: decr     General Gait Details: verbal cues for sequence, RW positioning, posture; cues for neutral position of L LE as tolerated   Stairs             Wheelchair Mobility     Tilt Bed    Modified Rankin (Stroke Patients Only)       Balance                                            Communication Communication Communication: No apparent difficulties  Cognition Arousal: Alert Behavior During Therapy: WFL for tasks assessed/performed   PT - Cognitive impairments: No apparent impairments                         Following commands: Intact      Cueing    Exercises      General Comments        Pertinent Vitals/Pain Pain Assessment Pain Assessment: 0-10 Pain Score: 6  Pain Location: Left hip Pain Descriptors / Indicators: Aching,  Grimacing, Guarding, Sore Pain Intervention(s): Monitored during session, Repositioned, Ice applied    Home Living                          Prior Function            PT Goals (current goals can now be found in the care plan section) Progress towards PT goals: Progressing toward goals    Frequency    7X/week      PT Plan      Co-evaluation              AM-PAC PT 6 Clicks Mobility   Outcome Measure  Help needed turning from your back to your side while in a flat bed without using bedrails?: A Little Help needed moving from lying on your back to sitting on the side of a flat bed without using bedrails?: A Little Help needed moving to and from a bed to a chair (including a wheelchair)?: A  Little Help needed standing up from a chair using your arms (e.g., wheelchair or bedside chair)?: A Little Help needed to walk in hospital room?: A Lot Help needed climbing 3-5 steps with a railing? : A Lot 6 Click Score: 16    End of Session Equipment Utilized During Treatment: Gait belt Activity Tolerance: Patient limited by pain Patient left: in chair;with call bell/phone within reach;with nursing/sitter in room Nurse Communication: Mobility status PT Visit Diagnosis: Other abnormalities of gait and mobility (R26.89)     Time: 8955-8891 PT Time Calculation (min) (ACUTE ONLY): 24 min  Charges:    $Gait Training: 23-37 mins PT General Charges $$ ACUTE PT VISIT: 1 Visit                    Tari KLEIN, DPT Physical Therapist Acute Rehabilitation Services Office: [phone]   [name] 03/06/2024, 3:57 PM

## 2024-03-06 NOTE — Progress Notes (Signed)
 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 Pt is currently LLE WBAT and no formal hip precautions. Pt PMH includes but is not limited to: OSA, HTN, asthma, and L hip surgery.    PT Comments  Pt agreeable to mobilize again however upon taking initial steps, pt reporting increased pain and need to sit down. Pt assisted safely to sitting on EOB.  Pt requiring increased time and breaks between movement due to pain however returned to supine.  Pt assisted with performing a couple exercises however limited by pain and muscle spasms.  Pt also reported increased dizziness as therapist about to leave room, so vitals obtained: 135/75 mmHg, 86 bpm, SpO2 100% room air.  RN notified.      If plan is discharge home, recommend the following: A little help with walking and/or transfers;A little help with bathing/dressing/bathroom;Assistance with cooking/housework;Assist for transportation;Help with stairs or ramp for entrance   Can travel by private vehicle        Equipment Recommendations  Rolling walker (2 wheels)    Recommendations for Other Services       Precautions / Restrictions Precautions Precautions: Fall Restrictions LLE Weight Bearing Per Provider Order: Weight bearing as tolerated     Mobility  Bed Mobility Overal bed mobility: Needs Assistance Bed Mobility: Sit to Supine      Sit to supine: Mod assist   General bed mobility comments: initial assist for L LE per request however pt with increased pain and required assist for bil LEs onto bed    Transfers Overall transfer level: Needs assistance Equipment used: Rolling walker (2 wheels) Transfers: Sit to/from Stand Sit to Stand: Min assist Stand pivot transfers: Min  assist         General transfer comment: verbal cues for positioning and technique, pt tends to keep R hip in increased external rotation to initiate stand; cues for heavy use of UEs to self assist (history of chronic bil hip pain); min assist for transition of hands upon rise, pt only able to take a few steps and requested to lay down so assisted safely with transfer to bed; assist also for positioning of L LE upon sitting for pain control    Ambulation/Gait   Stairs             Wheelchair Mobility     Tilt Bed    Modified Rankin (Stroke Patients Only)       Balance                                            Communication Communication Communication: No apparent difficulties  Cognition Arousal: Alert Behavior During Therapy: WFL for tasks assessed/performed   PT - Cognitive impairments: No apparent impairments                         Following commands: Intact      Cueing    Exercises Total Joint Exercises Ankle Circles/Pumps: AROM, Both, 10 reps Quad Sets: AROM, Left, 10 reps Heel Slides: AAROM, Left, 10 reps, Limitations Heel Slides Limitations: unable to perform in neutral hip position, tends to need  a little external rotation due to his history (for pain control) Hip ABduction/ADduction: AAROM, Left, 10 reps, Limitations Hip Abduction/Adduction Limitations: limited AAROM due to pain    General Comments        Pertinent Vitals/Pain Pain Assessment Pain Assessment: 0-10 Pain Score: 10-Worst pain ever Pain Location: Left hip and thigh Pain Descriptors / Indicators: Aching, Grimacing, Guarding, Sore, Tightness, Spasm Pain Intervention(s): Monitored during session, Repositioned, Patient requesting pain meds-RN notified, Premedicated before session    Home Living                          Prior Function            PT Goals (current goals can now be found in the care plan section) Progress towards PT goals:  Progressing toward goals    Frequency    7X/week      PT Plan      Co-evaluation              AM-PAC PT 6 Clicks Mobility   Outcome Measure  Help needed turning from your back to your side while in a flat bed without using bedrails?: A Little Help needed moving from lying on your back to sitting on the side of a flat bed without using bedrails?: A Little Help needed moving to and from a bed to a chair (including a wheelchair)?: A Little Help needed standing up from a chair using your arms (e.g., wheelchair or bedside chair)?: A Little Help needed to walk in hospital room?: A Lot Help needed climbing 3-5 steps with a railing? : A Lot 6 Click Score: 16    End of Session Equipment Utilized During Treatment: Gait belt Activity Tolerance: Patient limited by pain Patient left: in bed;with call bell/phone within reach;with family/visitor present Nurse Communication: Mobility status;Patient requests pain meds PT Visit Diagnosis: Other abnormalities of gait and mobility (R26.89);Pain Pain - Right/Left: Left Pain - part of body: Hip     Time: 8496-8466 PT Time Calculation (min) (ACUTE ONLY): 30 min  Charges:     $Therapeutic Exercise: 8-22 mins $Therapeutic Activity: 8-22 mins PT General Charges $$ ACUTE PT VISIT: 1 Visit                     Tari KLEIN, DPT Physical Therapist Acute Rehabilitation Services Office: [phone]    [name] 03/06/2024, 4:10 PM

## 2024-03-06 NOTE — Discharge Summary (Addendum)
 Physician Discharge Summary  Patient ID: Justin Walls MRN: [id number] DOB/AGE: October 02, 1975 48 y.o.  Admit date: 03/05/2024 Discharge date: 03/09/24  Admission Diagnoses:  Osteoarthritis of left hip  Discharge Diagnoses:  Principal Problem:   Osteoarthritis of left hip   Past Medical History:  Diagnosis Date   Arthritis    Asthma    Hypertension    Pre-diabetes     Surgeries: Procedure(s): ARTHROPLASTY, HIP, TOTAL,POSTERIOR APPROACH on 03/05/2024   Consultants (if any):   Discharged Condition: Improved  Hospital Course: Justin Walls is an 48 y.o. male who was admitted 03/05/2024 with a diagnosis of Osteoarthritis of left hip and went to the operating room on 03/05/2024 and underwent the above named procedures.  Post op course was complicated by pain, nausea, and high blood pressure requiring prolonged hospitalization before he could be safely discharged home on POD4.  He was given perioperative antibiotics:  Anti-infectives (From admission, onward)    Start     Dose/Rate Route Frequency Ordered Stop   03/05/24 1900  vancomycin  (VANCOCIN ) IVPB 1000 mg/200 mL premix        1,000 mg 200 mL/hr over 60 Minutes Intravenous Every 12 hours 03/05/24 1754 03/05/24 1926   03/05/24 0600  vancomycin  (VANCOCIN ) IVPB 1000 mg/200 mL premix        1,000 mg 200 mL/hr over 60 Minutes Intravenous  Once 03/05/24 0546 03/05/24 1843   03/05/24 0600  ciprofloxacin  (CIPRO ) IVPB 400 mg  Status:  Discontinued        400 mg 200 mL/hr over 60 Minutes Intravenous Every 8 hours 03/05/24 0546 03/05/24 1741     .  He was given sequential compression devices, early ambulation, and aspriin for DVT prophylaxis.  He benefited maximally from the hospital stay and there were no complications.    Recent vital signs:  Vitals:   03/08/24 2243 03/09/24 0610  BP: 128/80 112/70  Pulse: (!) 108 95  Resp: 15 14  Temp: (!) 97.4 F (36.3 C) 98.9 F (37.2 C)  SpO2: 100% 100%    Recent laboratory studies:   Lab Results  Component Value Date   HGB 8.9 (L) 03/07/2024   HGB 8.9 (L) 03/06/2024   HGB 11.3 (L) 03/05/2024   Lab Results  Component Value Date   WBC 10.4 03/07/2024   PLT 181 03/07/2024   Lab Results  Component Value Date   INR 1.0 06/04/2021   Lab Results  Component Value Date   NA 137 03/06/2024   K 3.5 03/06/2024   CL 104 03/06/2024   CO2 24 03/06/2024   BUN 13 03/06/2024   CREATININE 0.95 03/06/2024   GLUCOSE 132 (H) 03/06/2024    Discharge Medications:   Allergies as of 03/09/2024       Reactions   Amoxicillin Anaphylaxis, Rash        Medication List     TAKE these medications    acetaminophen  500 MG tablet Commonly known as: TYLENOL  Take 2 tablets (1,000 mg total) by mouth every 8 (eight) hours as needed.   aspirin  EC 81 MG tablet Take 1 tablet (81 mg total) by mouth 2 (two) times daily for 28 days. Swallow whole.   celecoxib  100 MG capsule Commonly known as: CeleBREX  Take 1 capsule (100 mg total) by mouth 2 (two) times daily for 14 days.   losartan -hydrochlorothiazide  50-12.5 MG tablet Commonly known as: HYZAAR TAKE 1 TABLET BY MOUTH EVERY DAY   methocarbamol  500 MG tablet Commonly known as: ROBAXIN  Take 1 tablet (  500 mg total) by mouth every 8 (eight) hours as needed for up to 10 days for muscle spasms.   omeprazole  40 MG capsule Commonly known as: PRILOSEC Take 1 capsule (40 mg total) by mouth daily for 21 days.   ondansetron  4 MG tablet Commonly known as: Zofran  Take 1 tablet (4 mg total) by mouth every 8 (eight) hours as needed for up to 14 days for nausea or vomiting.   oxyCODONE  5 MG immediate release tablet Commonly known as: Roxicodone  Take 1 tablet (5 mg total) by mouth every 4 (four) hours as needed for up to 7 days for severe pain (pain score 7-10) or moderate pain (pain score 4-6).   polyethylene glycol 17 g packet Commonly known as: MiraLax  Take 17 g by mouth daily.        Diagnostic Studies: DG HIP UNILAT WITH  PELVIS 2-3 VIEWS LEFT Result Date: 03/07/2024 CLINICAL DATA:  Postop left hip. EXAM: DG HIP (WITH OR WITHOUT PELVIS) 2-3V LEFT COMPARISON:  03/05/2024 FINDINGS: Left hip arthroplasty in expected alignment. No periprosthetic lucency or fracture. Recent postsurgical change includes air and edema in the soft tissues. Chronic right hip arthropathy. IMPRESSION: Left hip arthroplasty without postoperative complication. Electronically Signed   By: Andrea Gasman M.D.   On: 03/07/2024 17:02   DG HIP UNILAT W OR W/O PELVIS 2-3 VIEWS LEFT Result Date: 03/05/2024 CLINICAL DATA:  Postop. EXAM: DG HIP (WITH OR WITHOUT PELVIS) 2-3V LEFT COMPARISON:  None Available. FINDINGS: Left hip arthroplasty in expected alignment. No periprosthetic lucency or fracture. Recent postsurgical change includes air and edema in the soft tissues. IMPRESSION: Left hip arthroplasty without immediate postoperative complication. Electronically Signed   By: Andrea Gasman M.D.   On: 03/05/2024 10:54   DG Pelvis Portable Result Date: 03/05/2024 CLINICAL DATA:  Elective surgery. EXAM: PORTABLE PELVIS 1-2 VIEWS COMPARISON:  None Available. FINDINGS: Single intraoperative cross-table view of the pelvis submitted from the operating room. Imaging obtained during left hip arthroplasty. The acetabular cup is in place. Femoral spacer. IMPRESSION: Intraoperative spot view during left hip arthroplasty. Electronically Signed   By: Andrea Gasman M.D.   On: 03/05/2024 10:54    Disposition: Discharge disposition: 01-Home or Self Care       Discharge Instructions     Call MD / Call 911   Complete by: As directed    If you experience chest pain or shortness of breath, CALL 911 and be transported to the hospital emergency room.  If you develope a fever above 101 F, pus (white drainage) or increased drainage or redness at the wound, or calf pain, call your surgeon's office.   Call MD / Call 911   Complete by: As directed    If you experience  chest pain or shortness of breath, CALL 911 and be transported to the hospital emergency room.  If you develope a fever above 101 F, pus (white drainage) or increased drainage or redness at the wound, or calf pain, call your surgeon's office.   Call MD for:  difficulty breathing, headache or visual disturbances   Complete by: As directed    Call MD for:  redness, tenderness, or signs of infection (pain, swelling, redness, odor or green/yellow discharge around incision site)   Complete by: As directed    Call MD for:  temperature >100.4   Complete by: As directed    Constipation Prevention   Complete by: As directed    Drink plenty of fluids.  Prune juice may be helpful.  You may use a stool softener, such as Colace (over the counter) 100 mg twice a day.  Use MiraLax  (over the counter) for constipation as needed.   Constipation Prevention   Complete by: As directed    Drink plenty of fluids.  Prune juice may be helpful.  You may use a stool softener, such as Colace (over the counter) 100 mg twice a day.  Use MiraLax  (over the counter) for constipation as needed.   Diet - low sodium heart healthy   Complete by: As directed    Diet - low sodium heart healthy   Complete by: As directed    Driving restrictions   Complete by: As directed    No driving for 2-4 weeks   Follow the hip precautions as taught in Physical Therapy   Complete by: As directed    Increase activity slowly as tolerated   Complete by: As directed    Increase activity slowly as tolerated   Complete by: As directed    Post-operative opioid taper instructions:   Complete by: As directed    POST-OPERATIVE OPIOID TAPER INSTRUCTIONS: It is important to wean off of your opioid medication as soon as possible. If you do not need pain medication after your surgery it is ok to stop day one. Opioids include: Codeine, Hydrocodone (Norco, Vicodin), Oxycodone (Percocet, oxycontin ) and hydromorphone  amongst others.  Long term and even  short term use of opiods can cause: Increased pain response Dependence Constipation Depression Respiratory depression And more.  Withdrawal symptoms can include Flu like symptoms Nausea, vomiting And more Techniques to manage these symptoms Hydrate well Eat regular healthy meals Stay active Use relaxation techniques(deep breathing, meditating, yoga) Do Not substitute Alcohol  to help with tapering If you have been on opioids for less than two weeks and do not have pain than it is ok to stop all together.  Plan to wean off of opioids This plan should start within one week post op of your joint replacement. Maintain the same interval or time between taking each dose and first decrease the dose.  Cut the total daily intake of opioids by one tablet each day Next start to increase the time between doses. The last dose that should be eliminated is the evening dose.      Post-operative opioid taper instructions:   Complete by: As directed    POST-OPERATIVE OPIOID TAPER INSTRUCTIONS: It is important to wean off of your opioid medication as soon as possible. If you do not need pain medication after your surgery it is ok to stop day one. Opioids include: Codeine, Hydrocodone (Norco, Vicodin), Oxycodone (Percocet, oxycontin ) and hydromorphone  amongst others.  Long term and even short term use of opiods can cause: Increased pain response Dependence Constipation Depression Respiratory depression And more.  Withdrawal symptoms can include Flu like symptoms Nausea, vomiting And more Techniques to manage these symptoms Hydrate well Eat regular healthy meals Stay active Use relaxation techniques(deep breathing, meditating, yoga) Do Not substitute Alcohol  to help with tapering If you have been on opioids for less than two weeks and do not have pain than it is ok to stop all together.  Plan to wean off of opioids This plan should start within one week post op of your joint  replacement. Maintain the same interval or time between taking each dose and first decrease the dose.  Cut the total daily intake of opioids by one tablet each day Next start to increase the time between doses. The last dose that should be eliminated is  the evening dose.      TED hose   Complete by: As directed    Use stockings (TED hose) for 2 weeks on both leg(s).  Then for 2 more weeks on the surgical leg.  You may remove them at night for sleeping.        Follow-up Information     Edna Toribio LABOR, MD Follow up in 2 week(s).   Specialty: Orthopedic Surgery Contact information: [address] [place] [address] [phone]                    Discharge Instructions      INSTRUCTIONS AFTER JOINT REPLACEMENT   Remove items at home which could result in a fall. This includes throw rugs or furniture in walking pathways ICE to the affected joint every three hours while awake for 30 minutes at a time, for at least the first 3-5 days, and then as needed for pain and swelling.  Continue to use ice for pain and swelling. You may notice swelling that will progress down to the foot and ankle.  This is normal after surgery.  Elevate your leg when you are not up walking on it.   Continue to use the breathing machine you got in the hospital (incentive spirometer) which will help keep your temperature down.  It is common for your temperature to cycle up and down following surgery, especially at night when you are not up moving around and exerting yourself.  The breathing machine keeps your lungs expanded and your temperature down.  DIET:  As you were doing prior to hospitalization, we recommend a well-balanced diet.  DRESSING / WOUND CARE / SHOWERING:  Keep the surgical dressing until follow up.  The dressing is water proof, so you can shower without any extra covering.  IF THE DRESSING FALLS OFF or the wound gets wet inside, change the dressing with sterile gauze.   Please use good hand washing techniques before changing the dressing.  Do not use any lotions or creams on the incision until instructed by your surgeon.    ACTIVITY  Increase activity slowly as tolerated, but follow the weight bearing instructions below.   No driving for 6 weeks or until further direction given by your physician.  You cannot drive while taking narcotics.  No lifting or carrying greater than 10 lbs. until further directed by your surgeon. Avoid periods of inactivity such as sitting longer than an hour when not asleep. This helps prevent blood clots.  You may return to work once you are authorized by your doctor.   WEIGHT BEARING: Weight bearing as tolerated with assist device (walker, cane, etc) as directed, use it as long as suggested by your surgeon or therapist, typically at least 4-6 weeks.  EXERCISES  Results after joint replacement surgery are often greatly improved when you follow the exercise, range of motion and muscle strengthening exercises prescribed by your doctor. Safety measures are also important to protect the joint from further injury. Any time any of these exercises cause you to have increased pain or swelling, decrease what you are doing until you are comfortable again and then slowly increase them. If you have problems or questions, call your caregiver or physical therapist for advice.   Rehabilitation is important following a joint replacement. After just a few days of immobilization, the muscles of the leg can become weakened and shrink (atrophy).  These exercises are designed to build up the tone and strength  of the thigh and leg muscles and to improve motion. Often times heat used for twenty to thirty minutes before working out will loosen up your tissues and help with improving the range of motion but do not use heat for the first two weeks following surgery (sometimes heat can increase post-operative swelling).   These exercises can be done on a training  (exercise) mat, on the floor, on a table or on a bed. Use whatever works the best and is most comfortable for you.    Use music or television while you are exercising so that the exercises are a pleasant break in your day. This will make your life better with the exercises acting as a break in your routine that you can look forward to.   Perform all exercises about fifteen times, three times per day or as directed.  You should exercise both the operative leg and the other leg as well.  Exercises include:   Quad Sets - Tighten up the muscle on the front of the thigh (Quad) and hold for 5-10 seconds.   Straight Leg Raises - With your knee straight (if you were given a brace, keep it on), lift the leg to 60 degrees, hold for 3 seconds, and slowly lower the leg.  Perform this exercise against resistance later as your leg gets stronger.  Leg Slides: Lying on your back, slowly slide your foot toward your buttocks, bending your knee up off the floor (only go as far as is comfortable). Then slowly slide your foot back down until your leg is flat on the floor again.  Angel Wings: Lying on your back spread your legs to the side as far apart as you can without causing discomfort.  Hamstring Strength:  Lying on your back, push your heel against the floor with your leg straight by tightening up the muscles of your buttocks.  Repeat, but this time bend your knee to a comfortable angle, and push your heel against the floor.  You may put a pillow under the heel to make it more comfortable if necessary.   A rehabilitation program following joint replacement surgery can speed recovery and prevent re-injury in the future due to weakened muscles. Contact your doctor or a physical therapist for more information on knee rehabilitation.   CONSTIPATION:  Constipation is defined medically as fewer than three stools per week and severe constipation as less than one stool per week.  Even if you have a regular bowel pattern at  home, your normal regimen is likely to be disrupted due to multiple reasons following surgery.  Combination of anesthesia, postoperative narcotics, change in appetite and fluid intake all can affect your bowels.   YOU MUST use at least one of the following options; they are listed in order of increasing strength to get the job done.  They are all available over the counter, and you may need to use some, POSSIBLY even all of these options:    Drink plenty of fluids (prune juice may be helpful) and high fiber foods Colace 100 mg by mouth twice a day  Senokot for constipation as directed and as needed Dulcolax (bisacodyl), take with full glass of water  Miralax  (polyethylene glycol) once or twice a day as needed.  If you have tried all these things and are unable to have a bowel movement in the first 3-4 days after surgery call either your surgeon or your primary doctor.    If you experience loose stools or diarrhea, hold  the medications until you stool forms back up.  If your symptoms do not get better within 1 week or if they get worse, check with your doctor.  If you experience the worst abdominal pain ever or develop nausea or vomiting, please contact the office immediately for further recommendations for treatment.  ITCHING:  If you experience itching with your medications, try taking only a single pain pill, or even half a pain pill at a time.  You can also use Benadryl  over the counter for itching or also to help with sleep.   TED HOSE STOCKINGS:  Use stockings on both legs until for at least 2 weeks or as directed by physician office. They may be removed at night for sleeping.  MEDICATIONS:  See your medication summary on the "After Visit Summary" that nursing will review with you.  You may have some home medications which will be placed on hold until you complete the course of blood thinner medication.  It is important for you to complete the blood thinner medication as prescribed.  Blood  clot prevention (DVT Prophylaxis): After surgery you are at an increased risk for a blood clot.  You were prescribed a blood thinner, Aspirin  81mg , to be taken twice daily for a total of 4 weeks from surgery to help reduce your risk of getting a blood clot.  Signs of a pulmonary embolus (blood clot in the lungs) include sudden short of breath, feeling lightheaded or dizzy, chest pain with a deep breath, rapid pulse rapid breathing.  Signs of a blood clot in your arms or legs include new unexplained swelling and cramping, warm, red or darkened skin around the painful area.  Please call the office or 911 right away if these signs or symptoms develop.  PRECAUTIONS:   If you experience chest pain or shortness of breath - call 911 immediately for transfer to the hospital emergency department.   If you develop a fever greater that 101 F, purulent drainage from wound, increased redness or drainage from wound, foul odor from the wound/dressing, or calf pain - CONTACT YOUR SURGEON.                                                   FOLLOW-UP APPOINTMENTS:  If you do not already have a post-op appointment, please call the office for an appointment to be seen by your surgeon.  Guidelines for how soon to be seen are listed in your "After Visit Summary", but are typically between 2-3 weeks after surgery.  If you have a specialized bandage, you may be told to follow up 1 week after surgery.  POST-OPERATIVE OPIOID TAPER INSTRUCTIONS: It is important to wean off of your opioid medication as soon as possible. If you do not need pain medication after your surgery it is ok to stop day one. Opioids include: Codeine, Hydrocodone (Norco, Vicodin), Oxycodone (Percocet, oxycontin ) and hydromorphone  amongst others.  Long term and even short term use of opiods can cause: Increased pain response Dependence Constipation Depression Respiratory depression And more.  Withdrawal symptoms can include Flu like symptoms Nausea,  vomiting And more Techniques to manage these symptoms Hydrate well Eat regular healthy meals Stay active Use relaxation techniques(deep breathing, meditating, yoga) Do Not substitute Alcohol  to help with tapering If you have been on opioids for less than two weeks and do not have pain  than it is ok to stop all together.  Plan to wean off of opioids This plan should start within one week post op of your joint replacement. Maintain the same interval or time between taking each dose and first decrease the dose.  Cut the total daily intake of opioids by one tablet each day Next start to increase the time between doses. The last dose that should be eliminated is the evening dose.   MAKE SURE YOU:  Understand these instructions.  Get help right away if you are not doing well or get worse.    Thank you for letting us  be a part of your medical care team.  It is a privilege we respect greatly.  We hope these instructions will help you stay on track for a fast and full recovery!            Signed: Naveyah Iacovelli A Karron Goens 03/10/2024, 9:01 AM

## 2024-03-06 NOTE — Plan of Care (Signed)
  Problem: Activity: Goal: Risk for activity intolerance will decrease Outcome: Progressing   Problem: Coping: Goal: Level of anxiety will decrease Outcome: Progressing   Problem: Pain Managment: Goal: General experience of comfort will improve and/or be controlled Outcome: Progressing   Problem: Safety: Goal: Ability to remain free from injury will improve Outcome: Progressing

## 2024-03-07 ENCOUNTER — Inpatient Hospital Stay (HOSPITAL_COMMUNITY)

## 2024-03-07 DIAGNOSIS — R112 Nausea with vomiting, unspecified: Secondary | ICD-10-CM | POA: Diagnosis not present

## 2024-03-07 DIAGNOSIS — Z833 Family history of diabetes mellitus: Secondary | ICD-10-CM | POA: Diagnosis not present

## 2024-03-07 DIAGNOSIS — R7303 Prediabetes: Secondary | ICD-10-CM | POA: Diagnosis present

## 2024-03-07 DIAGNOSIS — I10 Essential (primary) hypertension: Secondary | ICD-10-CM | POA: Diagnosis present

## 2024-03-07 DIAGNOSIS — I2510 Atherosclerotic heart disease of native coronary artery without angina pectoris: Secondary | ICD-10-CM | POA: Diagnosis present

## 2024-03-07 DIAGNOSIS — J45909 Unspecified asthma, uncomplicated: Secondary | ICD-10-CM | POA: Diagnosis present

## 2024-03-07 DIAGNOSIS — Z8249 Family history of ischemic heart disease and other diseases of the circulatory system: Secondary | ICD-10-CM | POA: Diagnosis not present

## 2024-03-07 DIAGNOSIS — G4730 Sleep apnea, unspecified: Secondary | ICD-10-CM | POA: Diagnosis present

## 2024-03-07 DIAGNOSIS — M1612 Unilateral primary osteoarthritis, left hip: Secondary | ICD-10-CM | POA: Diagnosis present

## 2024-03-07 DIAGNOSIS — Z87891 Personal history of nicotine dependence: Secondary | ICD-10-CM | POA: Diagnosis not present

## 2024-03-07 DIAGNOSIS — Z7982 Long term (current) use of aspirin: Secondary | ICD-10-CM | POA: Diagnosis not present

## 2024-03-07 DIAGNOSIS — Z880 Allergy status to penicillin: Secondary | ICD-10-CM | POA: Diagnosis not present

## 2024-03-07 DIAGNOSIS — Z79899 Other long term (current) drug therapy: Secondary | ICD-10-CM | POA: Diagnosis not present

## 2024-03-07 LAB — CBC
HCT: 27.7 % — ABNORMAL LOW (ref 39.0–52.0)
Hemoglobin: 8.9 g/dL — ABNORMAL LOW (ref 13.0–17.0)
MCH: 25.1 pg — ABNORMAL LOW (ref 26.0–34.0)
MCHC: 32.1 g/dL (ref 30.0–36.0)
MCV: 78.2 fL — ABNORMAL LOW (ref 80.0–100.0)
Platelets: 181 10*3/uL (ref 150–400)
RBC: 3.54 MIL/uL — ABNORMAL LOW (ref 4.22–5.81)
RDW: 13.2 % (ref 11.5–15.5)
WBC: 10.4 10*3/uL (ref 4.0–10.5)
nRBC: 0 % (ref 0.0–0.2)

## 2024-03-07 NOTE — Progress Notes (Signed)
 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 Pt is currently LLE WBAT and no formal hip precautions. Pt PMH includes but is not limited to: OSA, HTN, asthma, and L hip surgery.    PT Comments  Pt premedicated for session.  Upon arriving to room, pt still in recliner and reports MD called approx 15 min ago and plans to have pt get xray of surgical hip.  Therefore ambulation deferred until after xray, and pt assisted back to bed (for ease of obtaining xray later).  Pt provided with fresh ice packs for left hip.      If plan is discharge home, recommend the following: A little help with walking and/or transfers;A little help with bathing/dressing/bathroom;Assistance with cooking/housework;Assist for transportation;Help with stairs or ramp for entrance   Can travel by private vehicle        Equipment Recommendations  Rolling walker (2 wheels)    Recommendations for Other Services       Precautions / Restrictions Precautions Precautions: Fall Restrictions LLE Weight Bearing Per Provider Order: Weight bearing as tolerated     Mobility  Bed Mobility Overal bed mobility: Needs Assistance Bed Mobility: Sit to Supine     Supine to sit: Mod assist Sit to supine: Mod assist   General bed mobility comments: pt attempted to self assist however increased pain and required assist for both LEs onto bed    Transfers Overall transfer level: Needs assistance Equipment used: Rolling walker (2 wheels) Transfers: Sit to/from Stand Sit to Stand: Min assist   Step pivot transfers: Contact guard assist       General transfer comment: verbal cues for positioning and technique, pt tends to keep R hip in increased external rotation to  initiate stand; cues for heavy use of UEs to self assist (history of chronic bil hip pain); assist for positioning of L LE upon sitting for pain control    Ambulation/Gait   Stairs             Wheelchair Mobility     Tilt Bed    Modified Rankin (Stroke Patients Only)       Balance                                            Communication Communication Communication: No apparent difficulties  Cognition Arousal: Alert Behavior During Therapy: WFL for tasks assessed/performed   PT - Cognitive impairments: No apparent impairments                         Following commands: Intact      Cueing    Exercises      General Comments        Pertinent Vitals/Pain Pain Assessment Pain Assessment: 0-10 Pain Score: 7  Pain Location: Left hip and thigh Pain Descriptors / Indicators: Aching, Grimacing, Guarding, Sore, Tightness, Spasm Pain Intervention(s): Monitored during session, Premedicated before session, Repositioned    Home Living                          Prior Function  PT Goals (current goals can now be found in the care plan section) Progress towards PT goals: Progressing toward goals    Frequency    7X/week      PT Plan      Co-evaluation              AM-PAC PT 6 Clicks Mobility   Outcome Measure  Help needed turning from your back to your side while in a flat bed without using bedrails?: A Little Help needed moving from lying on your back to sitting on the side of a flat bed without using bedrails?: A Lot Help needed moving to and from a bed to a chair (including a wheelchair)?: A Little Help needed standing up from a chair using your arms (e.g., wheelchair or bedside chair)?: A Little Help needed to walk in hospital room?: A Lot Help needed climbing 3-5 steps with a railing? : A Lot 6 Click Score: 15    End of Session Equipment Utilized During Treatment: Gait belt Activity  Tolerance: Patient limited by pain;Other (comment) (xray ordered so mobility limited) Patient left: in bed;with call bell/phone within reach;with family/visitor present Nurse Communication: Mobility status PT Visit Diagnosis: Other abnormalities of gait and mobility (R26.89);Pain Pain - Right/Left: Left Pain - part of body: Hip     Time: 8493-8478 PT Time Calculation (min) (ACUTE ONLY): 15 min  Charges:     $Therapeutic Activity: 8-22 mins PT General Charges $$ ACUTE PT VISIT: 1 Visit                     Tari KLEIN, DPT Physical Therapist Acute Rehabilitation Services Office: [phone]  [name] 03/07/2024, 4:55 PM

## 2024-03-07 NOTE — Progress Notes (Signed)
 Patient's progress has been slow with PT limtied by pain, He did have severe left hip DJD and still has severe R hip DJD. Will recheck xray to make sure everything still looks good regarding his left hip replacement before he goes home.

## 2024-03-07 NOTE — Progress Notes (Signed)
     Subjective:  Patient had some issues with pain and nausea again yesterday.  Unable to clear physical therapy.  Patient feels that he is doing a little better this morning.  In good spirits.  Hopeful for discharge home today.  No new concerns.   Objective:   VITALS:   Vitals:   03/06/24 0952 03/06/24 1345 03/06/24 2107 03/07/24 0512  BP: 113/65 137/87 139/76 (!) 146/78  Pulse: 72 84 100 97  Resp: 18 18 16 16   Temp: 99 F (37.2 C) 99 F (37.2 C) 99 F (37.2 C) (!) 100.5 F (38.1 C)  TempSrc:   Oral Oral  SpO2: 100% 99% 100% 100%  Weight:      Height:        Neurovascular intact Sensation intact distally Intact pulses distally Dorsiflexion/Plantar flexion intact Incision: dressing C/D/I    Lab Results  Component Value Date   WBC 10.4 03/07/2024   HGB 8.9 (L) 03/07/2024   HCT 27.7 (L) 03/07/2024   MCV 78.2 (L) 03/07/2024   PLT 181 03/07/2024   BMET    Component Value Date/Time   NA 137 03/06/2024 0317   NA 139 12/19/2023 1031   K 3.5 03/06/2024 0317   CL 104 03/06/2024 0317   CO2 24 03/06/2024 0317   GLUCOSE 132 (H) 03/06/2024 0317   BUN 13 03/06/2024 0317   BUN 11 12/19/2023 1031   CREATININE 0.95 03/06/2024 0317   CALCIUM 9.1 03/06/2024 0317   EGFR 100 12/19/2023 1031   GFRNONAA >60 03/06/2024 0317      Xray: Left total hip arthroplasty components in good position no adverse features  Assessment/Plan: 2 Days Post-Op   Principal Problem:   Osteoarthritis of left hip  Post op recs: WB: WBAT LLE, No formal hip precautions Abx: Vanco and Cipro  given history of amoxicillin anaphylaxis Imaging: PACU pelvis Xray Dressing: Aquacell, keep intact until follow up DVT prophylaxis: Aspirin  81BID starting POD1 Follow up: 2 weeks after surgery for a wound check with Dr. Edna at [facility].  Address: [address], [place], [place] [address]  Office Phone: [phone]     TORIBIO DELENA EDNA 03/07/2024, 7:41  AM   TORIBIO Edna, MD  Contact information:   [phone] 7am-5pm epic message Dr. Edna, or call office for patient follow up: [phone] After hours and holidays please check [email or web address] for group call information for [facility]

## 2024-03-07 NOTE — Progress Notes (Signed)
 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 Pt is currently LLE WBAT and no formal hip precautions. Pt PMH includes but is not limited to: OSA, HTN, asthma, and L hip surgery.    PT Comments  Pt assisted with bed mobility as pt with increased pain with mobilizing (premedicated for session).  Pt requiring increased time to mobilize and transition movements.  Pt with good upper body strength for self assisting. Pt's ambulation distance limited this morning by pain and dizziness (see mobility section below).     If plan is discharge home, recommend the following: A little help with walking and/or transfers;A little help with bathing/dressing/bathroom;Assistance with cooking/housework;Assist for transportation;Help with stairs or ramp for entrance   Can travel by private vehicle        Equipment Recommendations  Rolling walker (2 wheels)    Recommendations for Other Services       Precautions / Restrictions Precautions Precautions: Fall Restrictions LLE Weight Bearing Per Provider Order: Weight bearing as tolerated     Mobility  Bed Mobility Overal bed mobility: Needs Assistance Bed Mobility: Supine to Sit     Supine to sit: Mod assist     General bed mobility comments: pt attempted to self assist however increased pain once attempting LEs over EOB; assisted with both LEs over EOB together for pain control    Transfers Overall transfer level: Needs assistance Equipment used: Rolling walker (2 wheels) Transfers: Sit to/from Stand Sit to Stand: Contact guard assist, Min assist           General transfer comment: verbal cues for positioning and technique, pt tends to keep R hip in increased external rotation to  initiate stand; cues for heavy use of UEs to self assist (history of chronic bil hip pain); assist for positioning of L LE upon sitting for pain control    Ambulation/Gait Ambulation/Gait assistance: Contact guard assist Gait Distance (Feet): 40 Feet Assistive device: Rolling walker (2 wheels) Gait Pattern/deviations: Step-to pattern, Antalgic, Decreased stance time - left Gait velocity: decr     General Gait Details: verbal cues for sequence, RW positioning, posture; cues for neutral position of L LE and upright posture as tolerated; pt with increased dizziness and pain upon returning to room and requested recliner which was provided (vitals: 132/85 mmHg, 108 bpm, SpO2 100% on room air) - RN notified   Stairs             Wheelchair Mobility     Tilt Bed    Modified Rankin (Stroke Patients Only)       Balance                                            Communication Communication Communication: No apparent difficulties  Cognition Arousal: Alert Behavior During Therapy: WFL for tasks assessed/performed   PT - Cognitive impairments: No apparent impairments                         Following commands: Intact      Cueing    Exercises      General Comments  Pertinent Vitals/Pain Pain Assessment Pain Assessment: 0-10 Pain Score: 8  Pain Location: Left hip and thigh Pain Descriptors / Indicators: Aching, Grimacing, Guarding, Sore, Tightness, Spasm Pain Intervention(s): Monitored during session, Repositioned, Premedicated before session    Home Living                          Prior Function            PT Goals (current goals can now be found in the care plan section) Progress towards PT goals: Progressing toward goals    Frequency    7X/week      PT Plan      Co-evaluation              AM-PAC PT 6 Clicks Mobility   Outcome Measure  Help needed turning from your back to your side while in  a flat bed without using bedrails?: A Little Help needed moving from lying on your back to sitting on the side of a flat bed without using bedrails?: A Lot Help needed moving to and from a bed to a chair (including a wheelchair)?: A Little Help needed standing up from a chair using your arms (e.g., wheelchair or bedside chair)?: A Little Help needed to walk in hospital room?: A Lot Help needed climbing 3-5 steps with a railing? : A Lot 6 Click Score: 15    End of Session Equipment Utilized During Treatment: Gait belt Activity Tolerance: Patient limited by pain Patient left: in chair;with call bell/phone within reach;with family/visitor present Nurse Communication: Mobility status PT Visit Diagnosis: Other abnormalities of gait and mobility (R26.89);Pain Pain - Right/Left: Left Pain - part of body: Hip     Time: 8883-8854 PT Time Calculation (min) (ACUTE ONLY): 29 min  Charges:    $Gait Training: 23-37 mins PT General Charges $$ ACUTE PT VISIT: 1 Visit                     Tari KLEIN, DPT Physical Therapist Acute Rehabilitation Services Office: [phone]    [name] 03/07/2024, 1:42 PM

## 2024-03-08 NOTE — Plan of Care (Signed)
  Problem: Activity: Goal: Risk for activity intolerance will decrease Outcome: Progressing   Problem: Elimination: Goal: Will not experience complications related to bowel motility Outcome: Progressing   Problem: Pain Managment: Goal: General experience of comfort will improve and/or be controlled Outcome: Progressing   Problem: Safety: Goal: Ability to remain free from injury will improve Outcome: Progressing   Problem: Activity: Goal: Ability to tolerate increased activity will improve Outcome: Progressing

## 2024-03-08 NOTE — Plan of Care (Signed)
  Problem: Pain Managment: Goal: General experience of comfort will improve and/or be controlled Outcome: Progressing   Problem: Safety: Goal: Ability to remain free from injury will improve Outcome: Progressing   Problem: Activity: Goal: Ability to tolerate increased activity will improve Outcome: Progressing   Problem: Elimination: Goal: Will not experience complications related to bowel motility Outcome: Progressing

## 2024-03-08 NOTE — Progress Notes (Signed)
     Subjective:  Patient motivated to work with therapy but limited by pain and nausea. No new concerns.   Objective:   VITALS:   Vitals:   03/07/24 0512 03/07/24 1304 03/07/24 2316 03/08/24 0622  BP: (!) 146/78 130/77 113/74 121/73  Pulse: 97 94 (!) 101 100  Resp: 16 18 14 16   Temp: (!) 100.5 F (38.1 C) 99.3 F (37.4 C) 98.9 F (37.2 C) 98.7 F (37.1 C)  TempSrc: Oral  Oral Oral  SpO2: 100% 100% 99% 98%  Weight:      Height:        Neurovascular intact Sensation intact distally Intact pulses distally Dorsiflexion/Plantar flexion intact Incision: dressing C/D/I    Lab Results  Component Value Date   WBC 10.4 03/07/2024   HGB 8.9 (L) 03/07/2024   HCT 27.7 (L) 03/07/2024   MCV 78.2 (L) 03/07/2024   PLT 181 03/07/2024   BMET    Component Value Date/Time   NA 137 03/06/2024 0317   NA 139 12/19/2023 1031   K 3.5 03/06/2024 0317   CL 104 03/06/2024 0317   CO2 24 03/06/2024 0317   GLUCOSE 132 (H) 03/06/2024 0317   BUN 13 03/06/2024 0317   BUN 11 12/19/2023 1031   CREATININE 0.95 03/06/2024 0317   CALCIUM 9.1 03/06/2024 0317   EGFR 100 12/19/2023 1031   GFRNONAA >60 03/06/2024 0317      Xray: Left total hip arthroplasty components in good position no adverse features  Assessment/Plan: 3 Days Post-Op   Principal Problem:   Osteoarthritis of left hip  Post op recs: WB: WBAT LLE, No formal hip precautions Abx: Vanco and Cipro  given history of amoxicillin anaphylaxis Imaging: PACU pelvis Xray Dressing: Aquacell, keep intact until follow up DVT prophylaxis: Aspirin  81BID starting POD1 Follow up: 2 weeks after surgery for a wound check with Dr. Edna at [facility].  Address: [address], [place], [place] [address]  Office Phone: [phone]   Continue to work on mobilization. Hopeful for discharge today or tomorrow.   Aleck SAILOR Amrom Ore 03/08/2024, 11:02 AM     Contact information:   After hours and holidays  please check [email or web address] for group call information for [facility]

## 2024-03-08 NOTE — Progress Notes (Signed)
 Patient ambulated about 25 feet. He became very nauseous. Called charge RN to assist getting patient safely to the chair. Administered nausea medication and gave a ginger ale and saltine crackers. Will continue to monitor.

## 2024-03-08 NOTE — Progress Notes (Signed)
 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 Pt is currently LLE WBAT and no formal hip precautions. Pt PMH includes but is not limited to: OSA, HTN, asthma, and L hip surgery.    PT Comments  Pt sleeping on arrival to room and agreeable to ambulate. Pt ambulated approx 35 ft in hallway.  Distance continues to be limited by pain and mild nausea/dizziness.  Pt mostly CGA for mobility (however does need assist for LE for pain control with reclined positioning and bed mobility however anticipate pt will need this upon d/c).  Pt has good upper body strength to self assist with mobilizing.  Pt limited by pain/nausea at this time.  Discussed mobility and symptoms with RN.     If plan is discharge home, recommend the following: A little help with walking and/or transfers;A little help with bathing/dressing/bathroom;Assistance with cooking/housework;Assist for transportation;Help with stairs or ramp for entrance   Can travel by private vehicle        Equipment Recommendations  Rolling walker (2 wheels)    Recommendations for Other Services       Precautions / Restrictions Precautions Precautions: Fall Restrictions LLE Weight Bearing Per Provider Order: Weight bearing as tolerated     Mobility  Bed Mobility               General bed mobility comments: pt in recliner    Transfers Overall transfer level: Needs assistance Equipment used: Rolling walker (2 wheels) Transfers: Sit to/from Stand Sit to Stand: Contact guard assist           General transfer comment: verbal cues for positioning and technique, pt tends to keep R hip in increased external rotation to initiate stand; heavy use of UEs to self assist (history of  chronic bil hip pain); assist for positioning/support of L LE with leg recliner portion    Ambulation/Gait Ambulation/Gait assistance: Contact guard assist Gait Distance (Feet): 35 Feet Assistive device: Rolling walker (2 wheels) Gait Pattern/deviations: Step-to pattern, Antalgic, Decreased stance time - left Gait velocity: decr     General Gait Details: verbal cues for sequence, RW positioning, posture; cues for neutral position of L LE and upright posture as tolerated; pt requested recliner to sit down due to pain (tearful) and mild nausea   Stairs             Wheelchair Mobility     Tilt Bed    Modified Rankin (Stroke Patients Only)       Balance                                            Communication Communication Communication: No apparent difficulties  Cognition Arousal: Alert Behavior During Therapy: WFL for tasks assessed/performed   PT - Cognitive impairments: No apparent impairments                         Following commands: Intact      Cueing    Exercises      General Comments        Pertinent Vitals/Pain Pain Assessment Pain Assessment: 0-10 Pain Score: 9  Pain  Location: Left hip and thigh Pain Descriptors / Indicators: Aching, Grimacing, Guarding, Sore, Tightness, Spasm Pain Intervention(s): Monitored during session, Repositioned, Premedicated before session, Ice applied    Home Living                          Prior Function            PT Goals (current goals can now be found in the care plan section) Progress towards PT goals: Progressing toward goals    Frequency    7X/week      PT Plan      Co-evaluation              AM-PAC PT 6 Clicks Mobility   Outcome Measure  Help needed turning from your back to your side while in a flat bed without using bedrails?: A Little Help needed moving from lying on your back to sitting on the side of a flat bed without using  bedrails?: A Lot Help needed moving to and from a bed to a chair (including a wheelchair)?: A Little Help needed standing up from a chair using your arms (e.g., wheelchair or bedside chair)?: A Little Help needed to walk in hospital room?: A Little Help needed climbing 3-5 steps with a railing? : A Little 6 Click Score: 17    End of Session Equipment Utilized During Treatment: Gait belt Activity Tolerance: Patient limited by pain Patient left: in chair;with call bell/phone within reach;with family/visitor present Nurse Communication: Mobility status PT Visit Diagnosis: Other abnormalities of gait and mobility (R26.89);Pain Pain - Right/Left: Left Pain - part of body: Hip     Time: 1010-1042 PT Time Calculation (min) (ACUTE ONLY): 32 min  Charges:    $Gait Training: 23-37 mins PT General Charges $$ ACUTE PT VISIT: 1 Visit                     Tari KLEIN, DPT Physical Therapist Acute Rehabilitation Services Office: [phone]    [name] 03/08/2024, 1:43 PM

## 2024-03-09 NOTE — Progress Notes (Signed)
 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 Pt is currently LLE WBAT and no formal hip precautions. Pt PMH includes but is not limited to: OSA, HTN, asthma, and L hip surgery.    PT Comments  Pt reports feeling better today and able to mobilize without increasing pain, denies nausea and dizziness as well.  Pt states he performed LE exercises from HEP earlier this morning (except standing) and had no further questions.  Standing exercises encouraged to be added in a couple days and therapist demonstrated technique of each.  Pt reports understanding.  Pt feels ready for d/c home today.     If plan is discharge home, recommend the following: A little help with walking and/or transfers;A little help with bathing/dressing/bathroom;Assistance with cooking/housework;Assist for transportation;Help with stairs or ramp for entrance   Can travel by private vehicle        Equipment Recommendations  Rolling walker (2 wheels)    Recommendations for Other Services       Precautions / Restrictions Precautions Precautions: Fall Restrictions LLE Weight Bearing Per Provider Order: Weight bearing as tolerated     Mobility  Bed Mobility Overal bed mobility: Needs Assistance Bed Mobility: Supine to Sit     Supine to sit: Supervision, HOB elevated, Used rails     General bed mobility comments: pt able to self assist by bringing over LEs together with gait belt for pain control    Transfers Overall transfer level: Needs assistance Equipment used: Rolling walker (2 wheels) Transfers: Sit to/from Stand Sit to Stand: Contact guard assist, Supervision           General transfer comment: no cues required as pt has down technique that  assists with his pain control    Ambulation/Gait Ambulation/Gait assistance: Contact guard assist, Supervision Gait Distance (Feet): 60 Feet Assistive device: Rolling walker (2 wheels) Gait Pattern/deviations: Step-to pattern, Antalgic, Decreased stance time - left       General Gait Details: pt able to recall sequence, RW positioning, and posture cues; denies dizziness/nausea today   Stairs             Wheelchair Mobility     Tilt Bed    Modified Rankin (Stroke Patients Only)       Balance                                            Communication Communication Communication: No apparent difficulties  Cognition Arousal: Alert Behavior During Therapy: WFL for tasks assessed/performed   PT - Cognitive impairments: No apparent impairments                         Following commands: Intact      Cueing    Exercises      General Comments        Pertinent Vitals/Pain Pain Assessment Pain Assessment: 0-10 Pain Score: 7  Pain Location: Left hip and thigh Pain Descriptors / Indicators: Aching, Grimacing, Guarding, Sore, Tightness, Spasm Pain Intervention(s): Repositioned, Monitored during session, Ice applied, Premedicated before session    Home Living  Prior Function            PT Goals (current goals can now be found in the care plan section) Progress towards PT goals: Progressing toward goals    Frequency    7X/week      PT Plan      Co-evaluation              AM-PAC PT 6 Clicks Mobility   Outcome Measure  Help needed turning from your back to your side while in a flat bed without using bedrails?: A Little Help needed moving from lying on your back to sitting on the side of a flat bed without using bedrails?: A Little Help needed moving to and from a bed to a chair (including a wheelchair)?: A Little Help needed standing up from a chair using your arms (e.g.,  wheelchair or bedside chair)?: A Little Help needed to walk in hospital room?: A Little Help needed climbing 3-5 steps with a railing? : A Little 6 Click Score: 18    End of Session Equipment Utilized During Treatment: Gait belt Activity Tolerance: Patient tolerated treatment well Patient left: in chair;with call bell/phone within reach Nurse Communication: Mobility status PT Visit Diagnosis: Other abnormalities of gait and mobility (R26.89)     Time: 8963-8944 PT Time Calculation (min) (ACUTE ONLY): 19 min  Charges:    $Gait Training: 8-22 mins PT General Charges $$ ACUTE PT VISIT: 1 Visit                    Justin Walls, DPT Physical Therapist Acute Rehabilitation Services Office: [phone]    Justin Walls 03/09/2024, 1:14 PM

## 2024-03-09 NOTE — TOC Progression Note (Signed)
 Transition of Care [facility]) - Progression Note    Patient Details  Name: Justin Walls MRN: [id number] Date of Birth: November 11, 1975  [facility]) CM/SW Contact  Lorraine LILLETTE Fenton, LCSW Phone Number: 03/09/2024, 12:43 PM  Clinical Narrative:    Brayton from RN that pt now has an order for HHPT, and can TOC assist with setting up before DC.  CSW agreed to try.  CSW spoke with pt asked if he had a preferred provider or recently had HHPT services, he stated no.  I agreed to ask providers and if I can try to match him. Agreed to follow up with him in 30 minutes.   12:46- [facility] decilned- does not accept Ins.            [facility] have not responded.            [facility] declined I followed up with pt, - I was unable to match him with [facility]- His insurance is either not accepted or the limited spaces they accept-full. Advised him that a PCP can prescribe outpatient PT once he is ready to start . Update- MD indicated that pt is set up for HHPT already. No further TOC needs.     Barriers to Discharge: No Barriers Identified  Expected Discharge Plan and Services         Expected Discharge Date: 03/09/24               DME Arranged: Vannie rolling DME Agency: [employer]                   Social Determinants of Health (SDOH) Interventions SDOH Screenings   Food Insecurity: No Food Insecurity (03/05/2024)  Housing: Low Risk  (03/05/2024)  Transportation Needs: No Transportation Needs (03/05/2024)  Utilities: Not At Risk (03/05/2024)  Depression (PHQ2-9): Low Risk  (12/19/2023)  Tobacco Use: Medium Risk (03/05/2024)    Readmission Risk Interventions     No data to display

## 2024-03-09 NOTE — Progress Notes (Signed)
    Subjective: Patient reports pain as mild to moderate. Has mobilized OOB with PT. Struggling with pain and nausea.  Objective:   VITALS:   Vitals:   03/08/24 0622 03/08/24 1414 03/08/24 2243 03/09/24 0610  BP: 121/73 118/77 128/80 112/70  Pulse: 100  (!) 108 95  Resp: 16 19 15 14   Temp: 98.7 F (37.1 C) 98 F (36.7 C) (!) 97.4 F (36.3 C) 98.9 F (37.2 C)  TempSrc: Oral  Oral Oral  SpO2: 98% 100% 100% 100%  Weight:      Height:          Latest Ref Rng & Units 03/07/2024    3:18 AM 03/06/2024    3:17 AM 03/05/2024    6:50 AM  CBC  WBC 4.0 - 10.5 K/uL 10.4  10.3  5.1   Hemoglobin 13.0 - 17.0 g/dL 8.9  8.9  88.6   Hematocrit 39.0 - 52.0 % 27.7  27.5  35.2   Platelets 150 - 400 K/uL 181  186  240       Latest Ref Rng & Units 03/06/2024    3:17 AM 03/05/2024    6:50 AM 02/21/2024   11:00 AM  BMP  Glucose 70 - 99 mg/dL 867  896  887   BUN 6 - 20 mg/dL 13  14  14    Creatinine 0.61 - 1.24 mg/dL 9.04  9.18  9.14   Sodium 135 - 145 mmol/L 137  137  140   Potassium 3.5 - 5.1 mmol/L 3.5  3.2  3.3   Chloride 98 - 111 mmol/L 104  105  105   CO2 22 - 32 mmol/L 24  23  25    Calcium 8.9 - 10.3 mg/dL 9.1  9.2  9.5    Intake/Output      06/28 0701 06/29 0700 06/29 0701 06/30 0700   P.O. 1320    Total Intake(mL/kg) 1320 (13.3)    Urine (mL/kg/hr) 850 (0.4)    Emesis/NG output 0    Stool 0    Blood     Total Output 850    Net +470         Urine Occurrence 4 x    Stool Occurrence 2 x    Emesis Occurrence 2 x       Physical Exam: General: NAD.  Sitting up in bedside chair, calm, comfortable Resp: No increased wob Cardio: regular rate and rhythm ABD soft Neurologically intact MSK Neurovascularly intact Sensation intact distally Intact pulses distally Dorsiflexion/Plantar flexion intact Incision: dressing C/D/I   Assessment: 4 Days Post-Op  S/P Procedure(s) (LRB): ARTHROPLASTY, HIP, TOTAL,POSTERIOR APPROACH (Left) by Dr. Edna on 03/05/24  Principal  Problem:   Osteoarthritis of left hip   Plan:  Advance diet Up with therapy Incentive Spirometry Elevate and Apply ice  Weightbearing: WBAT LLE, no hip precautions Insicional and dressing care: Dressings left intact until follow-up and Reinforce dressings as needed Orthopedic device(s): None Showering: Keep dressing dry VTE prophylaxis: Aspirin  81mg  BID x 30 days, SCDs, ambulation Pain control: PRN Follow - up plan: 2 weeks Contact information:  Evalene Chancy MD, Gerard Large PA-C  Dispo: Home today     Gerard CHRISTELLA Large, [place] Office [phone] 03/09/2024, 9:15 AM

## 2024-03-10 ENCOUNTER — Other Ambulatory Visit: Payer: Self-pay

## 2024-03-10 ENCOUNTER — Emergency Department (HOSPITAL_COMMUNITY)

## 2024-03-10 ENCOUNTER — Encounter (HOSPITAL_COMMUNITY): Payer: Self-pay | Admitting: Emergency Medicine

## 2024-03-10 ENCOUNTER — Ambulatory Visit: Attending: Orthopedic Surgery

## 2024-03-10 ENCOUNTER — Emergency Department (HOSPITAL_COMMUNITY): Admission: EM | Admit: 2024-03-10 | Discharge: 2024-03-10 | Disposition: A | Discharge status: Home / Self Care

## 2024-03-10 DIAGNOSIS — R2681 Unsteadiness on feet: Secondary | ICD-10-CM | POA: Diagnosis present

## 2024-03-10 DIAGNOSIS — M25552 Pain in left hip: Secondary | ICD-10-CM | POA: Diagnosis present

## 2024-03-10 DIAGNOSIS — R059 Cough, unspecified: Secondary | ICD-10-CM | POA: Insufficient documentation

## 2024-03-10 DIAGNOSIS — M25551 Pain in right hip: Secondary | ICD-10-CM | POA: Insufficient documentation

## 2024-03-10 DIAGNOSIS — J45909 Unspecified asthma, uncomplicated: Secondary | ICD-10-CM | POA: Insufficient documentation

## 2024-03-10 DIAGNOSIS — R2689 Other abnormalities of gait and mobility: Secondary | ICD-10-CM | POA: Diagnosis present

## 2024-03-10 DIAGNOSIS — R0789 Other chest pain: Secondary | ICD-10-CM | POA: Insufficient documentation

## 2024-03-10 DIAGNOSIS — R0602 Shortness of breath: Secondary | ICD-10-CM | POA: Diagnosis present

## 2024-03-10 DIAGNOSIS — Z7982 Long term (current) use of aspirin: Secondary | ICD-10-CM | POA: Insufficient documentation

## 2024-03-10 DIAGNOSIS — M6281 Muscle weakness (generalized): Secondary | ICD-10-CM | POA: Diagnosis present

## 2024-03-10 LAB — BASIC METABOLIC PANEL WITH GFR
Anion gap: 10 (ref 5–15)
BUN: 12 mg/dL (ref 6–20)
CO2: 26 mmol/L (ref 22–32)
Calcium: 9.1 mg/dL (ref 8.9–10.3)
Chloride: 99 mmol/L (ref 98–111)
Creatinine, Ser: 0.84 mg/dL (ref 0.61–1.24)
GFR, Estimated: >60 mL/min (ref >60)
Glucose, Bld: 108 mg/dL — ABNORMAL HIGH (ref 70–99)
Potassium: 3.3 mmol/L — ABNORMAL LOW (ref 3.5–5.1)
Sodium: 135 mmol/L (ref 135–145)

## 2024-03-10 LAB — CBC
HCT: 28.1 % — ABNORMAL LOW (ref 39.0–52.0)
Hemoglobin: 9.1 g/dL — ABNORMAL LOW (ref 13.0–17.0)
MCH: 25.2 pg — ABNORMAL LOW (ref 26.0–34.0)
MCHC: 32.4 g/dL (ref 30.0–36.0)
MCV: 77.8 fL — ABNORMAL LOW (ref 80.0–100.0)
Platelets: 326 10*3/uL (ref 150–400)
RBC: 3.61 MIL/uL — ABNORMAL LOW (ref 4.22–5.81)
RDW: 13.2 % (ref 11.5–15.5)
WBC: 9.2 10*3/uL (ref 4.0–10.5)
nRBC: 0 % (ref 0.0–0.2)

## 2024-03-10 LAB — TROPONIN I (HIGH SENSITIVITY)
Troponin I (High Sensitivity): 5 ng/L (ref <18)
Troponin I (High Sensitivity): 6 ng/L (ref <18)

## 2024-03-10 LAB — D-DIMER, QUANTITATIVE: D-Dimer, Quant: 2.04 ug{FEU}/mL — ABNORMAL HIGH (ref 0.00–0.50)

## 2024-03-10 MED ORDER — IOHEXOL 350 MG/ML SOLN
80.0000 mL | Freq: Once | INTRAVENOUS | Status: AC | PRN
Start: 2024-03-10 — End: 2024-03-10
  Administered 2024-03-10: 80 mL via INTRAVENOUS

## 2024-03-10 MED ORDER — IPRATROPIUM-ALBUTEROL 0.5-2.5 (3) MG/3ML IN SOLN
3.0000 mL | Freq: Once | RESPIRATORY_TRACT | Status: AC
  Administered 2024-03-10: 3 mL via RESPIRATORY_TRACT
  Filled 2024-03-10: qty 3

## 2024-03-10 MED ORDER — ALBUTEROL SULFATE HFA 108 (90 BASE) MCG/ACT IN AERS: 2.0000 | INHALATION_SPRAY | RESPIRATORY_TRACT | 1 refills | Status: AC | PRN

## 2024-03-10 NOTE — ED Notes (Signed)
Patient resting in bed with family at bedside.

## 2024-03-10 NOTE — Discharge Instructions (Addendum)
 Your workup today was reassuring.  Please use the albuterol  as needed for shortness of breath.  I do not hear a lot of wheezing on exam so lets hold off on any steroids since you just had your surgery so this does not affect wound healing.  Please follow-up with your orthopedic surgeon.  Return to the ER for worsening symptoms.

## 2024-03-10 NOTE — Therapy (Signed)
 OUTPATIENT PHYSICAL THERAPY LOWER EXTREMITY EVALUATION   Patient Name: Justin Walls MRN: [id number] DOB:[birth year], 48 y.o., male Today's Date: 03/11/2024  END OF SESSION:  PT End of Session - 03/11/24 0805     Visit Number 1    Number of Visits 20    Date for PT Re-Evaluation 05/06/24    Authorization Type [place] [employer]    PT Start Time 0950    PT Stop Time 1015    PT Time Calculation (min) 25 min    Equipment Utilized During Treatment Gait belt    Activity Tolerance Patient tolerated treatment well    Behavior During Therapy WFL for tasks assessed/performed          Past Medical History:  Diagnosis Date   Arthritis    Asthma    Hypertension    Pre-diabetes    Past Surgical History:  Procedure Laterality Date   HIP SURGERY     hip sx     TOTAL HIP ARTHROPLASTY Left 03/05/2024   Procedure: ARTHROPLASTY, HIP, TOTAL,POSTERIOR APPROACH;  Surgeon: Edna Toribio LABOR, MD;  Location: [facility] ORS;  Service: Orthopedics;  Laterality: Left;   Patient Active Problem List   Diagnosis Date Noted   Osteoarthritis of left hip 03/05/2024   Severe sleep apnea 01/24/2024   Loud snoring 11/06/2022   Bilateral hip joint arthritis 09/21/2022   Primary hypertension 09/21/2022    PCP: Howell Lunger, DO  REFERRING PROVIDER: Edna Toribio LABOR, MD   REFERRING DIAG: M16.0 (ICD-10-CM) - Bilateral primary osteoarthritis of hip   THERAPY DIAG:  Pain in left hip  Pain in right hip  Muscle weakness (generalized)  Other abnormalities of gait and mobility  Unsteadiness on feet  Rationale for Evaluation and Treatment: Rehabilitation  ONSET DATE: DOS   SUBJECTIVE:   SUBJECTIVE STATEMENT: Pt presents to PT today s/p L THA performed by Dr. Edna on 03/05/2024. He arrives late after going to wrong clinic, very fatigued and had to be met in waiting area with PT bringing manual w/c. Had SCFE bilaterally with fixation as teenager, pain and movement has significantly  deteriorated over past few years. L LE has been so pain post surgery that he has not been able to independently move it without gait belt or family assist. R LE is also significantly impaired due to previous SCFE, plan is to replace this hip soon as well.   PERTINENT HISTORY: HTN, Previous hip surgeries post SCFE fx  PAIN:  Are you having pain?  Yes: NPRS scale: 8/10 Worst: 10/10 Pain location: L anterior hip, R lateral hip Pain description: sharp, tight, sore Aggravating factors: standing, walking, transfers Relieving factors: medication  PRECAUTIONS: None  RED FLAGS: None   WEIGHT BEARING RESTRICTIONS: WBAT L LE  FALLS:  Has patient fallen in last 6 months? No  LIVING ENVIRONMENT: Lives with: lives with their family Lives in: House/apartment Stairs: No Has following equipment at home: Walker - 2 wheeled and bed side commode  OCCUPATION: Catering   PLOF: Independent  PATIENT GOALS: improve both hips - be able to do yard work   OBJECTIVE:  Note: Objective measures were completed at Evaluation unless otherwise noted.  DIAGNOSTIC FINDINGS: See imaging   PATIENT SURVEYS:  LEFS: 2/80  COGNITION: Overall cognitive status: Within functional limits for tasks assessed     SENSATION: WFL  POSTURE: rounded shoulders, forward head, and flexed trunk    LOWER EXTREMITY MMT:  MMT Right eval Left eval  Hip flexion 2+/5   Hip extension  Hip abduction 2+/5   Hip adduction    Hip internal rotation    Hip external rotation    Knee flexion 3+/5 3/5  Knee extension 3+/5 3/5  Ankle dorsiflexion    Ankle plantarflexion    Ankle inversion    Ankle eversion     (Blank rows = not tested)  FUNCTIONAL TESTS:  30 Second Sit to Stand: 1 reps - Mod A  GAIT: Distance walked: 49ft Assistive device utilized: Environmental consultant - 2 wheeled - W/C follow Level of assistance: Mod A Comments: decrease WB L LE - decreased R swing, heavy UE reliance   TREATMENT: [facility] Adult PT  Treatment:                                                DATE: 03/10/2024 Self Care: Review of HEP, POC, education on need for movement for DVT prevention and progression post surgery   PATIENT EDUCATION:  Education details: eval findings, LEFS, HEP, POC Person educated: Patient Education method: Explanation, Demonstration, and Handouts Education comprehension: verbalized understanding and returned demonstration  HOME EXERCISE PROGRAM: Access Code: [email or web address] URL: [email or web address] Date: 03/10/2024 Prepared by: Alm Kingdom  Exercises - Supine Quadricep Sets  - 4-5 x daily - 7 x weekly - 2-3 sets - 10 reps - 5 sec hold - Supine Gluteal Sets  - 4-5 x daily - 7 x weekly - 3 sets - 10 reps - 5 sec hold - Supine Ankle Pumps  - 4-5 x daily - 7 x weekly - 3 sets - 20-30 reps  ASSESSMENT:  CLINICAL IMPRESSION: Patient is a 48 y.o. M who was seen today for physical therapy evaluation and treatment s/p L THA performed on 03/05/2024. Assessment today complicated by pt arriving late and complex medical hx. His L hip was extremely painful today and his R LE is significantly limited due to SCFE in childhood. Has to sit with R LE significantly adduction dur to tightness and pain, also unable to support LLE without gait belt. LEFS score shows severe decrease in subjective functional ability below PLOF post op. Pt would benefit from skilled PT services working on improving functional ability and movement post THA.   OBJECTIVE IMPAIRMENTS: Abnormal gait, decreased activity tolerance, decreased mobility, difficulty walking, decreased ROM, decreased strength, impaired flexibility, improper body mechanics, postural dysfunction, and pain  ACTIVITY LIMITATIONS: carrying, lifting, sitting, standing, squatting, sleeping, stairs, transfers, bed mobility, bathing, dressing, and locomotion level  PARTICIPATION LIMITATIONS: meal prep, cleaning, driving, shopping, community activity, occupation, yard  work, and school  PERSONAL FACTORS: Fitness, Time since onset of injury/illness/exacerbation, and 3+ comorbidities: HTN, Previous hip surgeries post SCFE fx are also affecting patient's functional outcome.   REHAB POTENTIAL: Good  CLINICAL DECISION MAKING: Evolving/moderate complexity  EVALUATION COMPLEXITY: Moderate   GOALS: Goals reviewed with patient? No  SHORT TERM GOALS: Target date: 03/31/2024   Pt will be compliant and knowledgeable with initial HEP for improved comfort and carryover Baseline: initial HEP given  Goal status: INITIAL  2.  Pt will self report left hip pain no greater than 7/10 for improved comfort and functional ability Baseline: 10/10 at worst Goal status: INITIAL   LONG TERM GOALS: Target date: 05/05/2024   Pt will improve LEFS to no less than 20/80 as proxy for functional improvement with home ADLs and higher level community activity Baseline: 2/80 Goal status: INITIAL  2.  Pt will self report left hip pain no greater than 3/10 for improved comfort and functional ability Baseline: 10/10 at worst Goal status: INITIAL   3.  Pt will increase 30 Second Sit to Stand rep count to no less than 5 reps for improved balance, strength, and functional mobility Baseline: 1 reps - heavy UE reliance  Goal status: INITIAL   4.  Pt will decrease TUG by MDC value for improved balance and safety with home and community navigation Baseline: will assess next session Goal status: INITIAL    5.  Pt will be able to ambulate 245ft Mod I with least restrictive assistance device  Baseline:  Goal status: INITIAL   PLAN:  PT FREQUENCY: 2x/week  PT DURATION: 12 weeks  PLANNED INTERVENTIONS: 97110-Therapeutic exercises, 97530- Therapeutic activity, 97112- Neuromuscular re-education, 97535- Self Care, and 02859- Manual therapy  PLAN FOR NEXT SESSION: take vitals, TUG, assess HEP response, attempt lying supine - assess gait tolerance   For all possible CPT codes,  reference the Planned Interventions line above.     Check all conditions that are expected to impact treatment: {Conditions expected to impact treatment:Diabetes mellitus and Musculoskeletal disorders   If treatment provided at initial evaluation, no treatment charged due to lack of authorization.       Alm JAYSON Kingdom, PT 03/11/2024, 8:08 AM

## 2024-03-10 NOTE — ED Triage Notes (Signed)
 Patient presents post surgery this past wednesday. While having physical therapy today, the patient experienced shortness of breath. At home he had chest wall pain that was worse with palpation. Pain was worsened since this morning. He has not been drinking a lot of fluid. Patient reports he feels heavy.    EMS vitals: 150/92 BP 100 P 99% SPO2 on room air 112 CBG 18 RR

## 2024-03-10 NOTE — ED Provider Notes (Signed)
 [place] EMERGENCY DEPARTMENT AT [facility] Provider Note   CSN: [id number] Arrival date & time: 03/10/24  1758     Patient presents with: Chest Pain and Shortness of Breath   Justin Walls is a 48 y.o. male.   48 year old male with past medical history of asthma with hip replacement on the 25th presenting to the emergency department today with shortness of breath.  The patient states that he has been having chest tightness and some shortness of breath since this morning.  He states that today was the first day started physical therapy.  He reports that he thought that he may just be deconditioning from his recent surgery.  He states that this persisted throughout the day and was getting little worse so he came to the ER for further evaluation.  He denies any calf pain or swelling.  Denies any hemoptysis.  He denies any pleuritic pain.  He states that he was told after surgery that he did aspirate during intubation.  Reports very minimal cough since he has gotten home.  He came to the emergency department today for further evaluation regarding this.        Prior to Admission medications   Medication Sig Start Date End Date Taking? Authorizing Provider  albuterol  (VENTOLIN  HFA) 108 (90 Base) MCG/ACT inhaler Inhale 2 puffs into the lungs every 4 (four) hours as needed for wheezing or shortness of breath. 03/10/24  Yes Ula Prentice SAUNDERS, MD  acetaminophen  (TYLENOL ) 500 MG tablet Take 2 tablets (1,000 mg total) by mouth every 8 (eight) hours as needed. 03/05/24 04/04/24  Renae Bernarda HERO, PA-C  aspirin  EC 81 MG tablet Take 1 tablet (81 mg total) by mouth 2 (two) times daily for 28 days. Swallow whole. 03/06/24 04/03/24  Renae Bernarda HERO, PA-C  celecoxib  (CELEBREX ) 100 MG capsule Take 1 capsule (100 mg total) by mouth 2 (two) times daily for 14 days. 03/05/24 03/19/24  Renae Bernarda HERO, PA-C  losartan -hydrochlorothiazide  (HYZAAR) 50-12.5 MG tablet TAKE 1 TABLET BY MOUTH EVERY DAY  11/22/23   Howell Lunger, DO  methocarbamol  (ROBAXIN ) 500 MG tablet Take 1 tablet (500 mg total) by mouth every 8 (eight) hours as needed for up to 10 days for muscle spasms. 03/05/24 03/15/24  Renae Bernarda HERO, PA-C  omeprazole  (PRILOSEC) 40 MG capsule Take 1 capsule (40 mg total) by mouth daily for 21 days. 03/05/24 03/26/24  Renae Bernarda HERO, PA-C  ondansetron  (ZOFRAN ) 4 MG tablet Take 1 tablet (4 mg total) by mouth every 8 (eight) hours as needed for up to 14 days for nausea or vomiting. 03/05/24 03/19/24  Renae Bernarda HERO, PA-C  oxyCODONE  (ROXICODONE ) 5 MG immediate release tablet Take 1 tablet (5 mg total) by mouth every 4 (four) hours as needed for up to 7 days for severe pain (pain score 7-10) or moderate pain (pain score 4-6). 03/05/24 03/12/24  Renae Bernarda HERO, PA-C  polyethylene glycol (MIRALAX ) 17 g packet Take 17 g by mouth daily. 03/05/24   Renae Bernarda HERO, PA-C    Allergies: Amoxicillin    Review of Systems  Respiratory:  Positive for cough, chest tightness and shortness of breath.   All other systems reviewed and are negative.   Updated Vital Signs BP 135/70   Pulse 98   Temp 98.1 F (36.7 C) (Oral)   Resp 19   SpO2 100%   Physical Exam Vitals and nursing note reviewed.   Gen: NAD Eyes: PERRL, EOMI HEENT: no oropharyngeal swelling Neck: trachea  midline Resp: Diminished at bilateral lung bases Card: RRR, no murmurs, rubs, or gallops Abd: nontender, nondistended Extremities: no calf tenderness, no edema Vascular: 2+ radial pulses bilaterally, 2+ DP pulses bilaterally Neuro: no focal deficits Skin: no rashes Psyc: acting appropriately   (all labs ordered are listed, but only abnormal results are displayed) Labs Reviewed  BASIC METABOLIC PANEL WITH GFR - Abnormal; Notable for the following components:      Result Value   Potassium 3.3 (*)    Glucose, Bld 108 (*)    All other components within normal limits  CBC - Abnormal; Notable for the following  components:   RBC 3.61 (*)    Hemoglobin 9.1 (*)    HCT 28.1 (*)    MCV 77.8 (*)    MCH 25.2 (*)    All other components within normal limits  D-DIMER, QUANTITATIVE - Abnormal; Notable for the following components:   D-Dimer, Quant 2.04 (*)    All other components within normal limits  TROPONIN I (HIGH SENSITIVITY)  TROPONIN I (HIGH SENSITIVITY)    EKG: None  Radiology: CT Angio Chest PE W and/or Wo Contrast Result Date: 03/10/2024 CLINICAL DATA:  Suspected pulmonary embolism. EXAM: CT ANGIOGRAPHY CHEST WITH CONTRAST TECHNIQUE: Multidetector CT imaging of the chest was performed using the standard protocol during bolus administration of intravenous contrast. Multiplanar CT image reconstructions and MIPs were obtained to evaluate the vascular anatomy. RADIATION DOSE REDUCTION: This exam was performed according to the departmental dose-optimization program which includes automated exposure control, adjustment of the mA and/or kV according to patient size and/or use of iterative reconstruction technique. CONTRAST:  80mL OMNIPAQUE  IOHEXOL  350 MG/ML SOLN COMPARISON:  None Available. FINDINGS: Cardiovascular: Satisfactory opacification of the pulmonary arteries to the segmental level. No evidence of pulmonary embolism. Normal heart size. No pericardial effusion. Mediastinum/Nodes: No enlarged mediastinal, hilar, or axillary lymph nodes. Thyroid gland, trachea, and esophagus demonstrate no significant findings. Lungs/Pleura: Lungs are clear. No pleural effusion or pneumothorax. Upper Abdomen: No acute abnormality. Musculoskeletal: No chest wall abnormality. No acute or significant osseous findings. Review of the MIP images confirms the above findings. IMPRESSION: No evidence of pulmonary embolism or acute cardiopulmonary disease. Electronically Signed   By: Suzen Dials M.D.   On: 03/10/2024 22:31   DG Chest Port 1 View Result Date: 03/10/2024 CLINICAL DATA:  Chest pain EXAM: PORTABLE CHEST 1  VIEW COMPARISON:  08/15/2020 FINDINGS: The heart size and mediastinal contours are within normal limits. Both lungs are clear. The visualized skeletal structures are unremarkable. IMPRESSION: No active disease. Electronically Signed   By: Norman Gatlin M.D.   On: 03/10/2024 19:02     Procedures   Medications Ordered in the ED  ipratropium-albuterol  (DUONEB) 0.5-2.5 (3) MG/3ML nebulizer solution 3 mL (3 mLs Nebulization Given 03/10/24 1858)  iohexol  (OMNIPAQUE ) 350 MG/ML injection 80 mL (80 mLs Intravenous Contrast Given 03/10/24 2214)                                    Medical Decision Making 48 year old male with recent surgery as well as history of asthma presenting to the emergency department today with chest tightness and shortness of breath.  I will further evaluate the patient here with basic labs as well as a chest x-ray, EKG, and troponin to evaluate for ACS, pulmonary edema, pulmonary infiltrates, or pneumothorax.  Will also obtain a D-dimer given his recent surgery to evaluate for pulmonary  embolism.  This may be due to deconditioning but this would be a diagnosis of exclusion.  Will give the patient a DuoNeb as he is slightly diminished here.  Will reevaluate for ultimate disposition.  The patient's work appears reassuring.  D-dimer is elevated.  Subsequent CT angiogram was negative.  He did feel better after the albuterol .  I do not appreciate any wheezing still on reassessment.  Will hold off on any steroids as the patient did have recent surgery since he does not have definitive wheezing so this will not affect wound healing.  He will be discharged with albuterol  with return precautions.  Amount and/or Complexity of Data Reviewed Labs: ordered. Radiology: ordered.  Risk Prescription drug management.        Final diagnoses:  Shortness of breath    ED Discharge Orders          Ordered    albuterol  (VENTOLIN  HFA) 108 (90 Base) MCG/ACT inhaler  Every 4 hours PRN         03/10/24 2240               Ula Prentice SAUNDERS, MD 03/10/24 2242

## 2024-03-10 NOTE — ED Notes (Addendum)
 SABRA

## 2024-03-10 NOTE — ED Notes (Signed)
 Patient d/c by Montgomery Seal, RN.

## 2024-03-11 ENCOUNTER — Other Ambulatory Visit: Payer: Self-pay

## 2024-03-12 ENCOUNTER — Ambulatory Visit: Attending: Orthopedic Surgery

## 2024-03-12 DIAGNOSIS — M25552 Pain in left hip: Secondary | ICD-10-CM | POA: Insufficient documentation

## 2024-03-12 DIAGNOSIS — R2681 Unsteadiness on feet: Secondary | ICD-10-CM | POA: Insufficient documentation

## 2024-03-12 DIAGNOSIS — R2689 Other abnormalities of gait and mobility: Secondary | ICD-10-CM | POA: Insufficient documentation

## 2024-03-12 DIAGNOSIS — M25551 Pain in right hip: Secondary | ICD-10-CM | POA: Insufficient documentation

## 2024-03-12 DIAGNOSIS — M6281 Muscle weakness (generalized): Secondary | ICD-10-CM | POA: Insufficient documentation

## 2024-03-12 NOTE — Therapy (Signed)
 OUTPATIENT PHYSICAL THERAPY TREATMENT   Patient Name: Justin Walls MRN: [id number] DOB:04-27-1976, 48 y.o., male Today's Date: 03/12/2024  END OF SESSION:  PT End of Session - 03/12/24 0932     Visit Number 2    Number of Visits 20    Date for PT Re-Evaluation 05/06/24    Authorization Type [place] [employer]    PT Start Time 0932    PT Stop Time 1012    PT Time Calculation (min) 40 min    Equipment Utilized During Treatment Gait belt    Activity Tolerance Patient tolerated treatment well    Behavior During Therapy WFL for tasks assessed/performed           Past Medical History:  Diagnosis Date   Arthritis    Asthma    Hypertension    Pre-diabetes    Past Surgical History:  Procedure Laterality Date   HIP SURGERY     hip sx     TOTAL HIP ARTHROPLASTY Left 03/05/2024   Procedure: ARTHROPLASTY, HIP, TOTAL,POSTERIOR APPROACH;  Surgeon: Edna Toribio LABOR, MD;  Location: [facility];  Service: Orthopedics;  Laterality: Left;   Patient Active Problem List   Diagnosis Date Noted   Osteoarthritis of left hip 03/05/2024   Severe sleep apnea 01/24/2024   Loud snoring 11/06/2022   Bilateral hip joint arthritis 09/21/2022   Primary hypertension 09/21/2022    PCP: Howell Lunger, DO  REFERRING PROVIDER: Edna Toribio LABOR, MD   REFERRING DIAG: M16.0 (ICD-10-CM) - Bilateral primary osteoarthritis of hip   THERAPY DIAG:  Pain in left hip  Pain in right hip  Muscle weakness (generalized)  Rationale for Evaluation and Treatment: Rehabilitation  ONSET DATE: DOS   SUBJECTIVE:   SUBJECTIVE STATEMENT: Pt arrives noting he is doing better than his eval day. Has been compliant with HEP, he is now laying in bed as well with improve tolerance.   EVAL: Pt presents to PT today s/p L THA performed by Dr. Edna on 03/05/2024. He arrives late after going to wrong clinic, very fatigued and had to be met in waiting area with PT bringing manual w/c. Had SCFE bilaterally with  fixation as teenager, pain and movement has significantly deteriorated over past few years. L LE has been so pain post surgery that he has not been able to independently move it without gait belt or family assist. R LE is also significantly impaired due to previous SCFE, plan is to replace this hip soon as well.   PERTINENT HISTORY: HTN, Previous hip surgeries post SCFE fx  PAIN:  Are you having pain?  Yes: NPRS scale: 8/10 Worst: 10/10 Pain location: L anterior hip, R lateral hip Pain description: sharp, tight, sore Aggravating factors: standing, walking, transfers Relieving factors: medication  PRECAUTIONS: None  RED FLAGS: None   WEIGHT BEARING RESTRICTIONS: WBAT L LE  FALLS:  Has patient fallen in last 6 months? No  LIVING ENVIRONMENT: Lives with: lives with their family Lives in: House/apartment Stairs: No Has following equipment at home: Walker - 2 wheeled and bed side commode  OCCUPATION: Catering   PLOF: Independent  PATIENT GOALS: improve both hips - be able to do yard work   OBJECTIVE:  Note: Objective measures were completed at Evaluation unless otherwise noted.  DIAGNOSTIC FINDINGS: See imaging   PATIENT SURVEYS:  LEFS: 2/80  COGNITION: Overall cognitive status: Within functional limits for tasks assessed     SENSATION: WFL  POSTURE: rounded shoulders, forward head, and flexed trunk  LOWER EXTREMITY MMT:  MMT Right eval Left eval  Hip flexion 2+/5   Hip extension    Hip abduction 2+/5   Hip adduction    Hip internal rotation    Hip external rotation    Knee flexion 3+/5 3/5  Knee extension 3+/5 3/5  Ankle dorsiflexion    Ankle plantarflexion    Ankle inversion    Ankle eversion     (Blank rows = not tested)  FUNCTIONAL TESTS:  30 Second Sit to Stand: 1 reps - Mod A TUG: 58 seconds with FWW - 03/12/2024  GAIT: Distance walked: 17ft Assistive device utilized: Environmental consultant - 2 wheeled - W/C follow Level of assistance: Mod A Comments:  decrease WB L LE - decreased R swing, heavy UE reliance   TREATMENT: [facility] Adult PT Treatment:                                                DATE: 03/12/2024 Tests/measures for goal setting Supine QS x 10 - 5 hold Supine SLR - unable Supine heel slide x 10 - PT assist to decrease external rotation Supine hip abd 2x5 L - strap assist LAQ 2x10 2.5# L STS with increasing L LE incoporate - heavy UE use with CGA x 5  [facility] Adult PT Treatment:                                                DATE: 03/10/2024 Self Care: Review of HEP, POC, education on need for movement for DVT prevention and progression post surgery   PATIENT EDUCATION:  Education details: eval findings, LEFS, HEP, POC Person educated: Patient Education method: Explanation, Demonstration, and Handouts Education comprehension: verbalized understanding and returned demonstration  HOME EXERCISE PROGRAM: Access Code: [email or web address] URL: [email or web address] Date: 03/12/2024 Prepared by: Alm Kingdom  Exercises - Supine Quadricep Sets  - 4-5 x daily - 7 x weekly - 2-3 sets - 10 reps - 5 sec hold - Supine Gluteal Sets  - 4-5 x daily - 7 x weekly - 3 sets - 10 reps - 5 sec hold - Supine Ankle Pumps  - 4-5 x daily - 7 x weekly - 3 sets - 20-30 reps - Supine Heel Slide  - 4-5 x daily - 7 x weekly - 2 sets - 10 reps - Supine Hip Abduction  - 4-5 x daily - 7 x weekly - 2 sets - 5 reps - Seated Long Arc Quad  - 1 x daily - 7 x weekly - 2 sets - 10 reps  ASSESSMENT:  CLINICAL IMPRESSION: Pt was able to complete prescribed exercises with improved tolerance today. TUG time shows he is operating well below desired functional mobility level for community ambulation, showing increased fall risk. Did demonstrate improved L LE motion and strength today, HEP was updated for continued strengthening at home. Will continue to progress as able per POC, next MD visit on 03/19/24.   EVAL: Patient is a 48 y.o. M who was seen today for  physical therapy evaluation and treatment s/p L THA performed on 03/05/2024. Assessment today complicated by pt arriving late and complex medical hx. His L hip was extremely painful today and his R LE is significantly limited due to SCFE  in childhood. Has to sit with R LE significantly adduction dur to tightness and pain, also unable to support LLE without gait belt. LEFS score shows severe decrease in subjective functional ability below PLOF post op. Pt would benefit from skilled PT services working on improving functional ability and movement post THA.   OBJECTIVE IMPAIRMENTS: Abnormal gait, decreased activity tolerance, decreased mobility, difficulty walking, decreased ROM, decreased strength, impaired flexibility, improper body mechanics, postural dysfunction, and pain  ACTIVITY LIMITATIONS: carrying, lifting, sitting, standing, squatting, sleeping, stairs, transfers, bed mobility, bathing, dressing, and locomotion level  PARTICIPATION LIMITATIONS: meal prep, cleaning, driving, shopping, community activity, occupation, yard work, and school  PERSONAL FACTORS: Fitness, Time since onset of injury/illness/exacerbation, and 3+ comorbidities: HTN, Previous hip surgeries post SCFE fx are also affecting patient's functional outcome.   REHAB POTENTIAL: Good  CLINICAL DECISION MAKING: Evolving/moderate complexity  EVALUATION COMPLEXITY: Moderate   GOALS: Goals reviewed with patient? No  SHORT TERM GOALS: Target date: 03/31/2024   Pt will be compliant and knowledgeable with initial HEP for improved comfort and carryover Baseline: initial HEP given  Goal status: INITIAL  2.  Pt will self report left hip pain no greater than 7/10 for improved comfort and functional ability Baseline: 10/10 at worst Goal status: INITIAL   LONG TERM GOALS: Target date: 05/05/2024   Pt will improve LEFS to no less than 20/80 as proxy for functional improvement with home ADLs and higher level community  activity Baseline: 2/80 Goal status: INITIAL   2.  Pt will self report left hip pain no greater than 3/10 for improved comfort and functional ability Baseline: 10/10 at worst Goal status: INITIAL   3.  Pt will increase 30 Second Sit to Stand rep count to no less than 5 reps for improved balance, strength, and functional mobility Baseline: 1 reps - heavy UE reliance  Goal status: INITIAL   4.  Pt will decrease TUG by MDC value for improved balance and safety with home and community navigation Baseline: will assess next session 03/12/2024: 58 seconds with FWW Goal status: INITIAL    5.  Pt will be able to ambulate 228ft Mod I with least restrictive assistance device  Baseline:  Goal status: INITIAL   PLAN:  PT FREQUENCY: 2x/week  PT DURATION: 12 weeks  PLANNED INTERVENTIONS: 97110-Therapeutic exercises, 97530- Therapeutic activity, 97112- Neuromuscular re-education, 97535- Self Care, and 02859- Manual therapy  PLAN FOR NEXT SESSION: take vitals, TUG, assess HEP response, attempt lying supine - assess gait tolerance   For all possible CPT codes, reference the Planned Interventions line above.     Check all conditions that are expected to impact treatment: {Conditions expected to impact treatment:Diabetes mellitus and Musculoskeletal disorders   If treatment provided at initial evaluation, no treatment charged due to lack of authorization.       Alm JAYSON Kingdom, PT 03/12/2024, 11:45 AM

## 2024-03-22 ENCOUNTER — Ambulatory Visit: Admitting: Physical Therapy

## 2024-03-22 ENCOUNTER — Encounter: Payer: Self-pay | Admitting: Physical Therapy

## 2024-03-22 DIAGNOSIS — M25551 Pain in right hip: Secondary | ICD-10-CM

## 2024-03-22 DIAGNOSIS — M6281 Muscle weakness (generalized): Secondary | ICD-10-CM

## 2024-03-22 DIAGNOSIS — R2689 Other abnormalities of gait and mobility: Secondary | ICD-10-CM

## 2024-03-22 DIAGNOSIS — M25552 Pain in left hip: Secondary | ICD-10-CM

## 2024-03-22 NOTE — Therapy (Signed)
 OUTPATIENT PHYSICAL THERAPY TREATMENT   Patient Name: Justin Walls MRN: [id number] DOB:January 06, 1976, 48 y.o., male Today's Date: 03/22/2024  END OF SESSION:  PT End of Session - 03/22/24 0910     Visit Number 3    Number of Visits 20    Date for PT Re-Evaluation 05/06/24    Authorization Type [facility] [employer]    PT Start Time 0910   pt arrived late   PT Stop Time 0948    PT Time Calculation (min) 38 min    Equipment Utilized During Treatment Gait belt    Activity Tolerance Patient tolerated treatment well    Behavior During Therapy WFL for tasks assessed/performed           Past Medical History:  Diagnosis Date   Arthritis    Asthma    Hypertension    Pre-diabetes    Past Surgical History:  Procedure Laterality Date   HIP SURGERY     hip sx     TOTAL HIP ARTHROPLASTY Left 03/05/2024   Procedure: ARTHROPLASTY, HIP, TOTAL,POSTERIOR APPROACH;  Surgeon: Justin Toribio LABOR, MD;  Location: [facility];  Service: Orthopedics;  Laterality: Left;   Patient Active Problem List   Diagnosis Date Noted   Osteoarthritis of left hip 03/05/2024   Severe sleep apnea 01/24/2024   Loud snoring 11/06/2022   Bilateral hip joint arthritis 09/21/2022   Primary hypertension 09/21/2022    PCP: Justin Lunger, DO  REFERRING PROVIDER: Edna Toribio LABOR, MD   REFERRING DIAG: M16.0 (ICD-10-CM) - Bilateral primary osteoarthritis of hip   THERAPY DIAG:  Pain in left hip  Pain in right hip  Muscle weakness (generalized)  Other abnormalities of gait and mobility  Rationale for Evaluation and Treatment: Rehabilitation  ONSET DATE: DOS   SUBJECTIVE:   SUBJECTIVE STATEMENT: Pt reports that he has ~5/10 pain but this is improving.  EVAL: Pt presents to PT today s/p L THA performed by Dr. Edna on 03/05/2024. He arrives late after going to wrong clinic, very fatigued and had to be met in waiting area with PT bringing manual w/c. Had SCFE bilaterally with fixation as teenager,  pain and movement has significantly deteriorated over past few years. L LE has been so pain post surgery that he has not been able to independently move it without gait belt or family assist. R LE is also significantly impaired due to previous SCFE, plan is to replace this hip soon as well.   PERTINENT HISTORY: HTN, Previous hip surgeries post SCFE fx  PAIN:  Are you having pain?  Yes: NPRS scale: 5/10 Worst: 10/10 Pain location: L anterior hip, R lateral hip Pain description: sharp, tight, sore Aggravating factors: standing, walking, transfers Relieving factors: medication  PRECAUTIONS: None  RED FLAGS: None   WEIGHT BEARING RESTRICTIONS: WBAT L LE  FALLS:  Has patient fallen in last 6 months? No  LIVING ENVIRONMENT: Lives with: lives with their family Lives in: House/apartment Stairs: No Has following equipment at home: Walker - 2 wheeled and bed side commode  OCCUPATION: Catering   PLOF: Independent  PATIENT GOALS: improve both hips - be able to do yard work   OBJECTIVE:  Note: Objective measures were completed at Evaluation unless otherwise noted.  DIAGNOSTIC FINDINGS: See imaging   PATIENT SURVEYS:  LEFS: 2/80  COGNITION: Overall cognitive status: Within functional limits for tasks assessed     SENSATION: WFL  POSTURE: rounded shoulders, forward head, and flexed trunk    LOWER EXTREMITY MMT:  MMT  Right eval Left eval  Hip flexion 2+/5   Hip extension    Hip abduction 2+/5   Hip adduction    Hip internal rotation    Hip external rotation    Knee flexion 3+/5 3/5  Knee extension 3+/5 3/5  Ankle dorsiflexion    Ankle plantarflexion    Ankle inversion    Ankle eversion     (Blank rows = not tested)  FUNCTIONAL TESTS:  30 Second Sit to Stand: 1 reps - Mod A TUG: 58 seconds with FWW - 03/12/2024  GAIT: Distance walked: 68ft Assistive device utilized: Environmental consultant - 2 wheeled - W/C follow Level of assistance: Mod A Comments: decrease WB L LE -  decreased R swing, heavy UE reliance   TREATMENT: [facility] Adult PT Treatment:                                                DATE: 03/22/2024 Supine QS 2x10 - 5 hold PROM for flexion to ~60* of flexion and gentle ER to tolerance Supine heel slide 2x10 No strap - 2x10 ER isometric - 5'' - 2x10 IR - 5'' - 2x10 LAQ 2x10 3# L Supine march - not tolerated Seated march (small arc)  [facility] Adult PT Treatment:                                                DATE: 03/10/2024 Self Care: Review of HEP, POC, education on need for movement for DVT prevention and progression post surgery   PATIENT EDUCATION:  Education details: eval findings, LEFS, HEP, POC Person educated: Patient Education method: Explanation, Demonstration, and Handouts Education comprehension: verbalized understanding and returned demonstration  HOME EXERCISE PROGRAM: Access Code: [email or web address] URL: [email or web address] Date: 03/12/2024 Prepared by: Justin Walls  Exercises - Supine Quadricep Sets  - 4-5 x daily - 7 x weekly - 2-3 sets - 10 reps - 5 sec hold - Supine Gluteal Sets  - 4-5 x daily - 7 x weekly - 3 sets - 10 reps - 5 sec hold - Supine Ankle Pumps  - 4-5 x daily - 7 x weekly - 3 sets - 20-30 reps - Supine Heel Slide  - 4-5 x daily - 7 x weekly - 2 sets - 10 reps - Supine Hip Abduction  - 4-5 x daily - 7 x weekly - 2 sets - 5 reps - Seated Long Arc Quad  - 1 x daily - 7 x weekly - 2 sets - 10 reps  ASSESSMENT:  CLINICAL IMPRESSION: Pt was able to complete prescribed exercises with improved tolerance today. Pt with reported lower pain and improved control of L hip during heel slides.  Remains significantly limited in active ER/IR/ and hip flexions.  Isometric ER/IR utilized for pain control and tolerable muscle activation to good effect.  EVAL: Patient is a 48 y.o. M who was seen today for physical therapy evaluation and treatment s/p L THA performed on 03/05/2024. Assessment today complicated by pt  arriving late and complex medical hx. His L hip was extremely painful today and his R LE is significantly limited due to SCFE in childhood. Has to sit with R LE significantly adduction dur to tightness and pain, also unable to support LLE  without gait belt. LEFS score shows severe decrease in subjective functional ability below PLOF post op. Pt would benefit from skilled PT services working on improving functional ability and movement post THA.   OBJECTIVE IMPAIRMENTS: Abnormal gait, decreased activity tolerance, decreased mobility, difficulty walking, decreased ROM, decreased strength, impaired flexibility, improper body mechanics, postural dysfunction, and pain  ACTIVITY LIMITATIONS: carrying, lifting, sitting, standing, squatting, sleeping, stairs, transfers, bed mobility, bathing, dressing, and locomotion level  PARTICIPATION LIMITATIONS: meal prep, cleaning, driving, shopping, community activity, occupation, yard work, and school  PERSONAL FACTORS: Fitness, Time since onset of injury/illness/exacerbation, and 3+ comorbidities: HTN, Previous hip surgeries post SCFE fx are also affecting patient's functional outcome.   REHAB POTENTIAL: Good  CLINICAL DECISION MAKING: Evolving/moderate complexity  EVALUATION COMPLEXITY: Moderate   GOALS: Goals reviewed with patient? No  SHORT TERM GOALS: Target date: 03/31/2024   Pt will be compliant and knowledgeable with initial HEP for improved comfort and carryover Baseline: initial HEP given  Goal status: INITIAL  2.  Pt will self report left hip pain no greater than 7/10 for improved comfort and functional ability Baseline: 10/10 at worst Goal status: INITIAL   LONG TERM GOALS: Target date: 05/05/2024   Pt will improve LEFS to no less than 20/80 as proxy for functional improvement with home ADLs and higher level community activity Baseline: 2/80 Goal status: INITIAL   2.  Pt will self report left hip pain no greater than 3/10 for improved  comfort and functional ability Baseline: 10/10 at worst Goal status: INITIAL   3.  Pt will increase 30 Second Sit to Stand rep count to no less than 5 reps for improved balance, strength, and functional mobility Baseline: 1 reps - heavy UE reliance  Goal status: INITIAL   4.  Pt will decrease TUG by MDC value for improved balance and safety with home and community navigation Baseline: will assess next session 03/12/2024: 58 seconds with FWW Goal status: INITIAL    5.  Pt will be able to ambulate 253ft Mod I with least restrictive assistance device  Baseline:  Goal status: INITIAL   PLAN:  PT FREQUENCY: 2x/week  PT DURATION: 12 weeks  PLANNED INTERVENTIONS: 97110-Therapeutic exercises, 97530- Therapeutic activity, 97112- Neuromuscular re-education, 97535- Self Care, and 02859- Manual therapy  PLAN FOR NEXT SESSION: take vitals, TUG, assess HEP response, attempt lying supine - assess gait tolerance   For all possible CPT codes, reference the Planned Interventions line above.     Check all conditions that are expected to impact treatment: {Conditions expected to impact treatment:Diabetes mellitus and Musculoskeletal disorders   If treatment provided at initial evaluation, no treatment charged due to lack of authorization.       Helene FORBES Gasmen, PT 03/22/2024, 11:24 AM

## 2024-03-25 ENCOUNTER — Encounter: Payer: Self-pay | Admitting: Physical Therapy

## 2024-03-25 ENCOUNTER — Ambulatory Visit: Admitting: Physical Therapy

## 2024-03-25 DIAGNOSIS — M25551 Pain in right hip: Secondary | ICD-10-CM

## 2024-03-25 DIAGNOSIS — M25552 Pain in left hip: Secondary | ICD-10-CM

## 2024-03-25 DIAGNOSIS — R2681 Unsteadiness on feet: Secondary | ICD-10-CM

## 2024-03-25 DIAGNOSIS — M6281 Muscle weakness (generalized): Secondary | ICD-10-CM

## 2024-03-25 DIAGNOSIS — R2689 Other abnormalities of gait and mobility: Secondary | ICD-10-CM

## 2024-03-25 NOTE — Therapy (Signed)
 OUTPATIENT PHYSICAL THERAPY TREATMENT   Patient Name: Justin Walls MRN: [id number] DOB:1976/06/25, 48 y.o., male Today's Date: 03/25/2024  END OF SESSION:  PT End of Session - 03/25/24 0935     Visit Number 4    Number of Visits 20    Date for PT Re-Evaluation 05/06/24    Authorization Type [place] [employer]    PT Start Time 0935   pt arrived late   PT Stop Time 1015    PT Time Calculation (min) 40 min    Equipment Utilized During Treatment Gait belt    Activity Tolerance Patient tolerated treatment well    Behavior During Therapy WFL for tasks assessed/performed           Past Medical History:  Diagnosis Date   Arthritis    Asthma    Hypertension    Pre-diabetes    Past Surgical History:  Procedure Laterality Date   HIP SURGERY     hip sx     TOTAL HIP ARTHROPLASTY Left 03/05/2024   Procedure: ARTHROPLASTY, HIP, TOTAL,POSTERIOR APPROACH;  Surgeon: Edna Toribio LABOR, MD;  Location: [facility];  Service: Orthopedics;  Laterality: Left;   Patient Active Problem List   Diagnosis Date Noted   Osteoarthritis of left hip 03/05/2024   Severe sleep apnea 01/24/2024   Loud snoring 11/06/2022   Bilateral hip joint arthritis 09/21/2022   Primary hypertension 09/21/2022    PCP: Howell Lunger, DO  REFERRING PROVIDER: Edna Toribio LABOR, MD   REFERRING DIAG: M16.0 (ICD-10-CM) - Bilateral primary osteoarthritis of hip   THERAPY DIAG:  Pain in left hip  Pain in right hip  Muscle weakness (generalized)  Other abnormalities of gait and mobility  Unsteadiness on feet  Rationale for Evaluation and Treatment: Rehabilitation  ONSET DATE: DOS   SUBJECTIVE:   SUBJECTIVE STATEMENT: Pt reports that he has ~4/10 pain but this is improving.  He has been doing some more walking recently.  EVAL: Pt presents to PT today s/p L THA performed by Dr. Edna on 03/05/2024. He arrives late after going to wrong clinic, very fatigued and had to be met in waiting area with PT  bringing manual w/c. Had SCFE bilaterally with fixation as teenager, pain and movement has significantly deteriorated over past few years. L LE has been so pain post surgery that he has not been able to independently move it without gait belt or family assist. R LE is also significantly impaired due to previous SCFE, plan is to replace this hip soon as well.   PERTINENT HISTORY: HTN, Previous hip surgeries post SCFE fx  PAIN:  Are you having pain?  Yes: NPRS scale: 5/10 Worst: 10/10 Pain location: L anterior hip, R lateral hip Pain description: sharp, tight, sore Aggravating factors: standing, walking, transfers Relieving factors: medication  PRECAUTIONS: None  RED FLAGS: None   WEIGHT BEARING RESTRICTIONS: WBAT L LE  FALLS:  Has patient fallen in last 6 months? No  LIVING ENVIRONMENT: Lives with: lives with their family Lives in: House/apartment Stairs: No Has following equipment at home: Walker - 2 wheeled and bed side commode  OCCUPATION: Catering   PLOF: Independent  PATIENT GOALS: improve both hips - be able to do yard work   OBJECTIVE:  Note: Objective measures were completed at Evaluation unless otherwise noted.  DIAGNOSTIC FINDINGS: See imaging   PATIENT SURVEYS:  LEFS: 2/80  COGNITION: Overall cognitive status: Within functional limits for tasks assessed     SENSATION: WFL  POSTURE: rounded shoulders,  forward head, and flexed trunk    LOWER EXTREMITY MMT:  MMT Right eval Left eval  Hip flexion 2+/5   Hip extension    Hip abduction 2+/5   Hip adduction    Hip internal rotation    Hip external rotation    Knee flexion 3+/5 3/5  Knee extension 3+/5 3/5  Ankle dorsiflexion    Ankle plantarflexion    Ankle inversion    Ankle eversion     (Blank rows = not tested)  FUNCTIONAL TESTS:  30 Second Sit to Stand: 1 reps - Mod A TUG: 58 seconds with FWW - 03/12/2024  GAIT: Distance walked: 35ft Assistive device utilized: Environmental consultant - 2 wheeled - W/C  follow Level of assistance: Mod A Comments: decrease WB L LE - decreased R swing, heavy UE reliance   TREATMENT: [facility] Adult PT Treatment:                                                DATE: 03/25/2024 Supine QS 2x10 - 5 hold PROM for flexion to ~60* of flexion and gentle ER to tolerance Supine heel slide 2x10 No strap - 2x10 ER isometric - 5'' - 2x10 IR - 5'' - 2x10 LAQ 2x15 3# L Supine march - not tolerated Standing heel raise - 2x10   [facility] Adult PT Treatment:                                                DATE: 03/10/2024 Self Care: Review of HEP, POC, education on need for movement for DVT prevention and progression post surgery   PATIENT EDUCATION:  Education details: eval findings, LEFS, HEP, POC Person educated: Patient Education method: Explanation, Demonstration, and Handouts Education comprehension: verbalized understanding and returned demonstration  HOME EXERCISE PROGRAM: Access Code: [email or web address] URL: [email or web address] Date: 03/12/2024 Prepared by: Alm Kingdom  Exercises - Supine Quadricep Sets  - 4-5 x daily - 7 x weekly - 2-3 sets - 10 reps - 5 sec hold - Supine Gluteal Sets  - 4-5 x daily - 7 x weekly - 3 sets - 10 reps - 5 sec hold - Supine Ankle Pumps  - 4-5 x daily - 7 x weekly - 3 sets - 20-30 reps - Supine Heel Slide  - 4-5 x daily - 7 x weekly - 2 sets - 10 reps - Supine Hip Abduction  - 4-5 x daily - 7 x weekly - 2 sets - 5 reps - Seated Long Arc Quad  - 1 x daily - 7 x weekly - 2 sets - 10 reps  ASSESSMENT:  CLINICAL IMPRESSION: Pt was able to complete prescribed exercises with improved tolerance today. Pt with reported lower pain and improved ambulation tolerance.  Continued to work on hip ROM with gentle strengthening.  Pt still limited to ~0 degrees of flexion with firm end feel with mild discomfort.  Introduced heel raises with no issue today.  EVAL: Patient is a 48 y.o. M who was seen today for physical therapy evaluation and  treatment s/p L THA performed on 03/05/2024. Assessment today complicated by pt arriving late and complex medical hx. His L hip was extremely painful today and his R LE is significantly limited due to SCFE  in childhood. Has to sit with R LE significantly adduction dur to tightness and pain, also unable to support LLE without gait belt. LEFS score shows severe decrease in subjective functional ability below PLOF post op. Pt would benefit from skilled PT services working on improving functional ability and movement post THA.   OBJECTIVE IMPAIRMENTS: Abnormal gait, decreased activity tolerance, decreased mobility, difficulty walking, decreased ROM, decreased strength, impaired flexibility, improper body mechanics, postural dysfunction, and pain  ACTIVITY LIMITATIONS: carrying, lifting, sitting, standing, squatting, sleeping, stairs, transfers, bed mobility, bathing, dressing, and locomotion level  PARTICIPATION LIMITATIONS: meal prep, cleaning, driving, shopping, community activity, occupation, yard work, and school  PERSONAL FACTORS: Fitness, Time since onset of injury/illness/exacerbation, and 3+ comorbidities: HTN, Previous hip surgeries post SCFE fx are also affecting patient's functional outcome.   REHAB POTENTIAL: Good  CLINICAL DECISION MAKING: Evolving/moderate complexity  EVALUATION COMPLEXITY: Moderate   GOALS: Goals reviewed with patient? No  SHORT TERM GOALS: Target date: 03/31/2024   Pt will be compliant and knowledgeable with initial HEP for improved comfort and carryover Baseline: initial HEP given  Goal status: INITIAL  2.  Pt will self report left hip pain no greater than 7/10 for improved comfort and functional ability Baseline: 10/10 at worst Goal status: INITIAL   LONG TERM GOALS: Target date: 05/05/2024   Pt will improve LEFS to no less than 20/80 as proxy for functional improvement with home ADLs and higher level community activity Baseline: 2/80 Goal status: INITIAL    2.  Pt will self report left hip pain no greater than 3/10 for improved comfort and functional ability Baseline: 10/10 at worst Goal status: INITIAL   3.  Pt will increase 30 Second Sit to Stand rep count to no less than 5 reps for improved balance, strength, and functional mobility Baseline: 1 reps - heavy UE reliance  Goal status: INITIAL   4.  Pt will decrease TUG by MDC value for improved balance and safety with home and community navigation Baseline: will assess next session 03/12/2024: 58 seconds with FWW Goal status: INITIAL    5.  Pt will be able to ambulate 272ft Mod I with least restrictive assistance device  Baseline:  Goal status: INITIAL   PLAN:  PT FREQUENCY: 2x/week  PT DURATION: 12 weeks  PLANNED INTERVENTIONS: 97110-Therapeutic exercises, 97530- Therapeutic activity, 97112- Neuromuscular re-education, 97535- Self Care, and 02859- Manual therapy  PLAN FOR NEXT SESSION: take vitals, TUG, assess HEP response, attempt lying supine - assess gait tolerance   For all possible CPT codes, reference the Planned Interventions line above.     Check all conditions that are expected to impact treatment: {Conditions expected to impact treatment:Diabetes mellitus and Musculoskeletal disorders   If treatment provided at initial evaluation, no treatment charged due to lack of authorization.       Helene FORBES Gasmen, PT 03/25/2024, 10:16 AM

## 2024-03-27 ENCOUNTER — Encounter: Payer: Self-pay | Admitting: Physical Therapy

## 2024-03-27 ENCOUNTER — Ambulatory Visit: Admitting: Physical Therapy

## 2024-03-27 DIAGNOSIS — M25552 Pain in left hip: Secondary | ICD-10-CM | POA: Diagnosis not present

## 2024-03-27 DIAGNOSIS — M6281 Muscle weakness (generalized): Secondary | ICD-10-CM

## 2024-03-27 DIAGNOSIS — M25551 Pain in right hip: Secondary | ICD-10-CM
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
## 2024-03-31 ENCOUNTER — Ambulatory Visit

## 2024-03-31 DIAGNOSIS — R2681 Unsteadiness on feet: Secondary | ICD-10-CM

## 2024-03-31 DIAGNOSIS — R2689 Other abnormalities of gait and mobility: Secondary | ICD-10-CM

## 2024-03-31 DIAGNOSIS — M6281 Muscle weakness (generalized): Secondary | ICD-10-CM

## 2024-03-31 DIAGNOSIS — M25551 Pain in right hip: Secondary | ICD-10-CM

## 2024-03-31 DIAGNOSIS — M25552 Pain in left hip: Secondary | ICD-10-CM

## 2024-03-31 NOTE — Therapy (Signed)
 OUTPATIENT PHYSICAL THERAPY TREATMENT   Patient Name: Justin Walls MRN: [id number] DOB:1976-03-03, 48 y.o., male Today's Date: 03/31/2024  END OF SESSION:  PT End of Session - 03/31/24 1014     Visit Number 6    Number of Visits 20    Date for PT Re-Evaluation 05/06/24    Authorization Type [place] [employer]    PT Start Time 1015    PT Stop Time 1055    PT Time Calculation (min) 40 min    Equipment Utilized During Treatment Gait belt    Activity Tolerance Patient tolerated treatment well    Behavior During Therapy WFL for tasks assessed/performed            Past Medical History:  Diagnosis Date   Arthritis    Asthma    Hypertension    Pre-diabetes    Past Surgical History:  Procedure Laterality Date   HIP SURGERY     hip sx     TOTAL HIP ARTHROPLASTY Left 03/05/2024   Procedure: ARTHROPLASTY, HIP, TOTAL,POSTERIOR APPROACH;  Surgeon: Edna Toribio LABOR, MD;  Location: [facility];  Service: Orthopedics;  Laterality: Left;   Patient Active Problem List   Diagnosis Date Noted   Osteoarthritis of left hip 03/05/2024   Severe sleep apnea 01/24/2024   Loud snoring 11/06/2022   Bilateral hip joint arthritis 09/21/2022   Primary hypertension 09/21/2022    PCP: Howell Lunger, DO  REFERRING PROVIDER: Edna Toribio LABOR, MD   REFERRING DIAG: M16.0 (ICD-10-CM) - Bilateral primary osteoarthritis of hip   THERAPY DIAG:  Pain in left hip  Pain in right hip  Muscle weakness (generalized)  Other abnormalities of gait and mobility  Unsteadiness on feet  Rationale for Evaluation and Treatment: Rehabilitation  ONSET DATE: DOS   SUBJECTIVE:   SUBJECTIVE STATEMENT: Pt presents to PT with continued L hip pain, although his R LE continues to be the more painful limiting factor. Has continued HEP compliance.  EVAL: Pt presents to PT today s/p L THA performed by Dr. Edna on 03/05/2024. He arrives late after going to wrong clinic, very fatigued and had to be met  in waiting area with PT bringing manual w/c. Had SCFE bilaterally with fixation as teenager, pain and movement has significantly deteriorated over past few years. L LE has been so pain post surgery that he has not been able to independently move it without gait belt or family assist. R LE is also significantly impaired due to previous SCFE, plan is to replace this hip soon as well.   PERTINENT HISTORY: HTN, Previous hip surgeries post SCFE fx  PAIN:  Are you having pain?  Yes: NPRS scale: 5/10 Worst: 10/10 Pain location: L anterior hip, R lateral hip Pain description: sharp, tight, sore Aggravating factors: standing, walking, transfers Relieving factors: medication  PRECAUTIONS: None  RED FLAGS: None   WEIGHT BEARING RESTRICTIONS: WBAT L LE  FALLS:  Has patient fallen in last 6 months? No  LIVING ENVIRONMENT: Lives with: lives with their family Lives in: House/apartment Stairs: No Has following equipment at home: Walker - 2 wheeled and bed side commode  OCCUPATION: Catering   PLOF: Independent  PATIENT GOALS: improve both hips - be able to do yard work   OBJECTIVE:  Note: Objective measures were completed at Evaluation unless otherwise noted.  DIAGNOSTIC FINDINGS: See imaging   PATIENT SURVEYS:  LEFS: 2/80  COGNITION: Overall cognitive status: Within functional limits for tasks assessed     SENSATION: WFL  POSTURE:  rounded shoulders, forward head, and flexed trunk    LOWER EXTREMITY MMT:  MMT Right eval Left eval  Hip flexion 2+/5   Hip extension    Hip abduction 2+/5   Hip adduction    Hip internal rotation    Hip external rotation    Knee flexion 3+/5 3/5  Knee extension 3+/5 3/5  Ankle dorsiflexion    Ankle plantarflexion    Ankle inversion    Ankle eversion     (Blank rows = not tested)   LE ROM:  ROM Right  Left 7/17  Hip flexion  65 PROM  Hip extension    Hip abduction    Hip adduction    Hip internal rotation    Hip external  rotation    Knee extension    Knee flexion    Ankle dorsiflexion    Ankle plantarflexion    Ankle inversion    Ankle eversion     (Blank rows = not tested, N = WNL, * = concordant pain with testing)  FUNCTIONAL TESTS:  30 Second Sit to Stand: 1 reps - Mod A TUG: 58 seconds with FWW - 03/12/2024  GAIT: Distance walked: 31ft Assistive device utilized: Environmental consultant - 2 wheeled - W/C follow Level of assistance: Mod A Comments: decrease WB L LE - decreased R swing, heavy UE reliance   TREATMENT: [facility] Adult PT Treatment:                                                DATE: 03/31/2024 LAQ 2x15 L 2.5# ER/IR with RTB in sitting 2x10 L Supine QS 2x10 - 5 hold Supine SLR 3x5 L PROM for flexion and gentle ER to tolerance Standing hip abd/ext 2x20 L STS 2x5 - with UE and high table working on eccentric lowering for L   PATIENT EDUCATION:  Education details: eval findings, LEFS, HEP, POC Person educated: Patient Education method: Explanation, Demonstration, and Handouts Education comprehension: verbalized understanding and returned demonstration  HOME EXERCISE PROGRAM: Access Code: [email or web address] URL: [email or web address] Date: 03/31/2024 Prepared by: Alm Kingdom  Exercises - Supine Quadricep Sets  - 4-5 x daily - 7 x weekly - 2-3 sets - 10 reps - 5 sec hold - Supine Gluteal Sets  - 4-5 x daily - 7 x weekly - 3 sets - 10 reps - 5 sec hold - Supine Ankle Pumps  - 4-5 x daily - 7 x weekly - 3 sets - 20-30 reps - Supine Heel Slide  - 4-5 x daily - 7 x weekly - 2 sets - 10 reps - Supine Hip Abduction  - 4-5 x daily - 7 x weekly - 2 sets - 5 reps - Seated Long Arc Quad  - 1 x daily - 7 x weekly - 2 sets - 10 reps - Supine Straight Leg Raise with Pelvic Floor Contraction  - 1 x daily - 7 x weekly - 3 sets - 10 reps - Supine Straight Leg Raise with Pelvic Floor Contraction  - 1 x daily - 7 x weekly - 3 sets - 5 reps - Standing Hip Abduction with Counter Support  - 1 x daily - 7 x  weekly - 2-3 sets - 20 reps - Standing Hip Extension with Counter Support  - 1 x daily - 7 x weekly - 2-3 sets - 20 reps  ASSESSMENT:  CLINICAL  IMPRESSION: Pt was able to complete all prescribed exercises with no adverse effect. Today he was able to progress into a small range SLR in supine for his L LE. Continues to benefit from PROM into L hip flexion. Overall he is progressing well post operatively and is more limited by non-operatively leg. He should continue to be seen and progressed with therapy, will continue to progress as able per POC.   EVAL: Patient is a 48 y.o. M who was seen today for physical therapy evaluation and treatment s/p L THA performed on 03/05/2024. Assessment today complicated by pt arriving late and complex medical hx. His L hip was extremely painful today and his R LE is significantly limited due to SCFE in childhood. Has to sit with R LE significantly adduction dur to tightness and pain, also unable to support LLE without gait belt. LEFS score shows severe decrease in subjective functional ability below PLOF post op. Pt would benefit from skilled PT services working on improving functional ability and movement post THA.   OBJECTIVE IMPAIRMENTS: Abnormal gait, decreased activity tolerance, decreased mobility, difficulty walking, decreased ROM, decreased strength, impaired flexibility, improper body mechanics, postural dysfunction, and pain  ACTIVITY LIMITATIONS: carrying, lifting, sitting, standing, squatting, sleeping, stairs, transfers, bed mobility, bathing, dressing, and locomotion level  PARTICIPATION LIMITATIONS: meal prep, cleaning, driving, shopping, community activity, occupation, yard work, and school  PERSONAL FACTORS: Fitness, Time since onset of injury/illness/exacerbation, and 3+ comorbidities: HTN, Previous hip surgeries post SCFE fx are also affecting patient's functional outcome.   REHAB POTENTIAL: Good  CLINICAL DECISION MAKING: Evolving/moderate  complexity  EVALUATION COMPLEXITY: Moderate   GOALS: Goals reviewed with patient? No  SHORT TERM GOALS: Target date: 03/31/2024   Pt will be compliant and knowledgeable with initial HEP for improved comfort and carryover Baseline: initial HEP given  Goal status: INITIAL  2.  Pt will self report left hip pain no greater than 7/10 for improved comfort and functional ability Baseline: 10/10 at worst Goal status: INITIAL   LONG TERM GOALS: Target date: 05/05/2024   Pt will improve LEFS to no less than 20/80 as proxy for functional improvement with home ADLs and higher level community activity Baseline: 2/80 Goal status: INITIAL   2.  Pt will self report left hip pain no greater than 3/10 for improved comfort and functional ability Baseline: 10/10 at worst Goal status: INITIAL   3.  Pt will increase 30 Second Sit to Stand rep count to no less than 5 reps for improved balance, strength, and functional mobility Baseline: 1 reps - heavy UE reliance  Goal status: INITIAL   4.  Pt will decrease TUG by MDC value for improved balance and safety with home and community navigation Baseline: will assess next session 03/12/2024: 58 seconds with FWW Goal status: INITIAL    5.  Pt will be able to ambulate 225ft Mod I with least restrictive assistance device  Baseline:  Goal status: INITIAL   PLAN:  PT FREQUENCY: 2x/week  PT DURATION: 12 weeks  PLANNED INTERVENTIONS: 97110-Therapeutic exercises, 97530- Therapeutic activity, 97112- Neuromuscular re-education, 97535- Self Care, and 02859- Manual therapy  PLAN FOR NEXT SESSION: take vitals, TUG, assess HEP response, attempt lying supine - assess gait tolerance   For all possible CPT codes, reference the Planned Interventions line above.     Check all conditions that are expected to impact treatment: {Conditions expected to impact treatment:Diabetes mellitus and Musculoskeletal disorders   If treatment provided at initial evaluation, no  treatment charged due  to lack of authorization.       Alm JAYSON Kingdom, PT 03/31/2024, 11:09 AM

## 2024-04-03 ENCOUNTER — Ambulatory Visit

## 2024-04-03 DIAGNOSIS — M25551 Pain in right hip: Secondary | ICD-10-CM

## 2024-04-03 DIAGNOSIS — R2681 Unsteadiness on feet: Secondary | ICD-10-CM

## 2024-04-03 DIAGNOSIS — R2689 Other abnormalities of gait and mobility: Secondary | ICD-10-CM

## 2024-04-03 DIAGNOSIS — M25552 Pain in left hip: Secondary | ICD-10-CM | POA: Diagnosis not present

## 2024-04-03 DIAGNOSIS — M6281 Muscle weakness (generalized): Secondary | ICD-10-CM

## 2024-04-03 NOTE — Therapy (Signed)
 OUTPATIENT PHYSICAL THERAPY TREATMENT   Patient Name: Justin Walls MRN: [id number] DOB:03/31/1976, 48 y.o., male Today's Date: 04/03/2024  END OF SESSION:  PT End of Session - 04/03/24 1028     Visit Number 7    Number of Visits 20    Date for PT Re-Evaluation 05/06/24    Authorization Type [place] [employer]    PT Start Time 1019    PT Stop Time 1058    PT Time Calculation (min) 39 min    Activity Tolerance Patient tolerated treatment well    Behavior During Therapy WFL for tasks assessed/performed             Past Medical History:  Diagnosis Date   Arthritis    Asthma    Hypertension    Pre-diabetes    Past Surgical History:  Procedure Laterality Date   HIP SURGERY     hip sx     TOTAL HIP ARTHROPLASTY Left 03/05/2024   Procedure: ARTHROPLASTY, HIP, TOTAL,POSTERIOR APPROACH;  Surgeon: Edna Toribio LABOR, MD;  Location: [facility];  Service: Orthopedics;  Laterality: Left;   Patient Active Problem List   Diagnosis Date Noted   Osteoarthritis of left hip 03/05/2024   Severe sleep apnea 01/24/2024   Loud snoring 11/06/2022   Bilateral hip joint arthritis 09/21/2022   Primary hypertension 09/21/2022    PCP: Howell Lunger, DO  REFERRING PROVIDER: Edna Toribio LABOR, MD   REFERRING DIAG: M16.0 (ICD-10-CM) - Bilateral primary osteoarthritis of hip   THERAPY DIAG:  Pain in left hip  Pain in right hip  Muscle weakness (generalized)  Other abnormalities of gait and mobility  Unsteadiness on feet  Rationale for Evaluation and Treatment: Rehabilitation  ONSET DATE: DOS   SUBJECTIVE:   SUBJECTIVE STATEMENT: Pt reports 4/10, cramping in upper hip, been sitting a lot past few days    EVAL: Pt presents to PT today s/p L THA performed by Dr. Edna on 03/05/2024. He arrives late after going to wrong clinic, very fatigued and had to be met in waiting area with PT bringing manual w/c. Had SCFE bilaterally with fixation as teenager, pain and movement has  significantly deteriorated over past few years. L LE has been so pain post surgery that he has not been able to independently move it without gait belt or family assist. R LE is also significantly impaired due to previous SCFE, plan is to replace this hip soon as well.   PERTINENT HISTORY: HTN, Previous hip surgeries post SCFE fx  PAIN:  Are you having pain?  Yes: NPRS scale: 5/10 Worst: 10/10 Pain location: L anterior hip, R lateral hip Pain description: sharp, tight, sore Aggravating factors: standing, walking, transfers Relieving factors: medication  PRECAUTIONS: None  RED FLAGS: None   WEIGHT BEARING RESTRICTIONS: WBAT L LE  FALLS:  Has patient fallen in last 6 months? No  LIVING ENVIRONMENT: Lives with: lives with their family Lives in: House/apartment Stairs: No Has following equipment at home: Walker - 2 wheeled and bed side commode  OCCUPATION: Catering   PLOF: Independent  PATIENT GOALS: improve both hips - be able to do yard work   OBJECTIVE:  Note: Objective measures were completed at Evaluation unless otherwise noted.  DIAGNOSTIC FINDINGS: See imaging   PATIENT SURVEYS:  LEFS: 2/80  COGNITION: Overall cognitive status: Within functional limits for tasks assessed     SENSATION: WFL  POSTURE: rounded shoulders, forward head, and flexed trunk    LOWER EXTREMITY MMT:  MMT Right eval  Left eval  Hip flexion 2+/5   Hip extension    Hip abduction 2+/5   Hip adduction    Hip internal rotation    Hip external rotation    Knee flexion 3+/5 3/5  Knee extension 3+/5 3/5  Ankle dorsiflexion    Ankle plantarflexion    Ankle inversion    Ankle eversion     (Blank rows = not tested)   LE ROM:  ROM Right  Left 7/17  Hip flexion  65 PROM  Hip extension    Hip abduction    Hip adduction    Hip internal rotation    Hip external rotation    Knee extension    Knee flexion    Ankle dorsiflexion    Ankle plantarflexion    Ankle inversion     Ankle eversion     (Blank rows = not tested, N = WNL, * = concordant pain with testing)  FUNCTIONAL TESTS:  30 Second Sit to Stand: 1 reps - Mod A TUG: 58 seconds with FWW - 03/12/2024  GAIT: Distance walked: 65ft Assistive device utilized: Environmental consultant - 2 wheeled - W/C follow Level of assistance: Mod A Comments: decrease WB L LE - decreased R swing, heavy UE reliance   TREATMENT: [facility] Adult PT Treatment:                                                DATE: 04/03/2024 LAQ 2x15 L  Seated ball squeeze b/t knees 2x 10 3 sec hold Supine heel slides 2x10 Supine QS 2x10 - 5 hold Supine SLR 3x5 L PROM for flexion with strap 5 sec hold 2x5 Standing hip abd/ext 2x10 L STS 2x10 - with UE and high table working on eccentric lowering for L   PATIENT EDUCATION:  Education details: eval findings, LEFS, HEP, POC Person educated: Patient Education method: Explanation, Demonstration, and Handouts Education comprehension: verbalized understanding and returned demonstration  HOME EXERCISE PROGRAM: Access Code: [email or web address] URL: [email or web address] Date: 03/31/2024 Prepared by: Alm Kingdom  Exercises - Supine Quadricep Sets  - 4-5 x daily - 7 x weekly - 2-3 sets - 10 reps - 5 sec hold - Supine Gluteal Sets  - 4-5 x daily - 7 x weekly - 3 sets - 10 reps - 5 sec hold - Supine Ankle Pumps  - 4-5 x daily - 7 x weekly - 3 sets - 20-30 reps - Supine Heel Slide  - 4-5 x daily - 7 x weekly - 2 sets - 10 reps - Supine Hip Abduction  - 4-5 x daily - 7 x weekly - 2 sets - 5 reps - Seated Long Arc Quad  - 1 x daily - 7 x weekly - 2 sets - 10 reps - Supine Straight Leg Raise with Pelvic Floor Contraction  - 1 x daily - 7 x weekly - 3 sets - 10 reps - Supine Straight Leg Raise with Pelvic Floor Contraction  - 1 x daily - 7 x weekly - 3 sets - 5 reps - Standing Hip Abduction with Counter Support  - 1 x daily - 7 x weekly - 2-3 sets - 20 reps - Standing Hip Extension with Counter Support  - 1 x  daily - 7 x weekly - 2-3 sets - 20 reps  ASSESSMENT:  CLINICAL IMPRESSION: Pt performed all exercises without increases in pain.  Added adduction squeeze exercise which the pt performed without pain. STS lowering phase was controlled well. Overall pt continues to progress with minimal pain and seeing improvements in function. Pt continues to benefit from skilled PT services to address deficits in ROM, strength, motor control and reducing pain.  EVAL: Patient is a 48 y.o. M who was seen today for physical therapy evaluation and treatment s/p L THA performed on 03/05/2024. Assessment today complicated by pt arriving late and complex medical hx. His L hip was extremely painful today and his R LE is significantly limited due to SCFE in childhood. Has to sit with R LE significantly adduction dur to tightness and pain, also unable to support LLE without gait belt. LEFS score shows severe decrease in subjective functional ability below PLOF post op. Pt would benefit from skilled PT services working on improving functional ability and movement post THA.   OBJECTIVE IMPAIRMENTS: Abnormal gait, decreased activity tolerance, decreased mobility, difficulty walking, decreased ROM, decreased strength, impaired flexibility, improper body mechanics, postural dysfunction, and pain  ACTIVITY LIMITATIONS: carrying, lifting, sitting, standing, squatting, sleeping, stairs, transfers, bed mobility, bathing, dressing, and locomotion level  PARTICIPATION LIMITATIONS: meal prep, cleaning, driving, shopping, community activity, occupation, yard work, and school  PERSONAL FACTORS: Fitness, Time since onset of injury/illness/exacerbation, and 3+ comorbidities: HTN, Previous hip surgeries post SCFE fx are also affecting patient's functional outcome.   REHAB POTENTIAL: Good  CLINICAL DECISION MAKING: Evolving/moderate complexity  EVALUATION COMPLEXITY: Moderate   GOALS: Goals reviewed with patient? No  SHORT TERM GOALS:  Target date: 03/31/2024   Pt will be compliant and knowledgeable with initial HEP for improved comfort and carryover Baseline: initial HEP given  Goal status: INITIAL  2.  Pt will self report left hip pain no greater than 7/10 for improved comfort and functional ability Baseline: 10/10 at worst Goal status: INITIAL   LONG TERM GOALS: Target date: 05/05/2024   Pt will improve LEFS to no less than 20/80 as proxy for functional improvement with home ADLs and higher level community activity Baseline: 2/80 Goal status: INITIAL   2.  Pt will self report left hip pain no greater than 3/10 for improved comfort and functional ability Baseline: 10/10 at worst Goal status: INITIAL   3.  Pt will increase 30 Second Sit to Stand rep count to no less than 5 reps for improved balance, strength, and functional mobility Baseline: 1 reps - heavy UE reliance  Goal status: INITIAL   4.  Pt will decrease TUG by MDC value for improved balance and safety with home and community navigation Baseline: will assess next session 03/12/2024: 58 seconds with FWW Goal status: INITIAL    5.  Pt will be able to ambulate 252ft Mod I with least restrictive assistance device  Baseline:  Goal status: INITIAL   PLAN:  PT FREQUENCY: 2x/week  PT DURATION: 12 weeks  PLANNED INTERVENTIONS: 97110-Therapeutic exercises, 97530- Therapeutic activity, 97112- Neuromuscular re-education, 97535- Self Care, and 02859- Manual therapy  PLAN FOR NEXT SESSION: take vitals, TUG, assess HEP response, attempt lying supine - assess gait tolerance   For all possible CPT codes, reference the Planned Interventions line above.     Check all conditions that are expected to impact treatment: {Conditions expected to impact treatment:Diabetes mellitus and Musculoskeletal disorders   If treatment provided at initial evaluation, no treatment charged due to lack of authorization.       Deionte Spivack, SPT 04/03/24 10:56 AM

## 2024-04-07 ENCOUNTER — Ambulatory Visit

## 2024-04-07 DIAGNOSIS — R2689 Other abnormalities of gait and mobility: Secondary | ICD-10-CM

## 2024-04-07 DIAGNOSIS — M25551 Pain in right hip: Secondary | ICD-10-CM

## 2024-04-07 DIAGNOSIS — M25552 Pain in left hip: Secondary | ICD-10-CM

## 2024-04-07 DIAGNOSIS — M6281 Muscle weakness (generalized): Secondary | ICD-10-CM

## 2024-04-07 DIAGNOSIS — R2681 Unsteadiness on feet: Secondary | ICD-10-CM

## 2024-04-07 NOTE — Therapy (Signed)
 OUTPATIENT PHYSICAL THERAPY TREATMENT   Patient Name: Justin Walls MRN: [id number] DOB:09/22/1975, 48 y.o., male Today's Date: 04/07/2024  END OF SESSION:  PT End of Session - 04/07/24 1022     Visit Number 8    Number of Visits 20    Date for PT Re-Evaluation 05/06/24    Authorization Type [place] [employer]    PT Start Time 1022    PT Stop Time 1100    PT Time Calculation (min) 38 min    Activity Tolerance Patient tolerated treatment well    Behavior During Therapy WFL for tasks assessed/performed              Past Medical History:  Diagnosis Date   Arthritis    Asthma    Hypertension    Pre-diabetes    Past Surgical History:  Procedure Laterality Date   HIP SURGERY     hip sx     TOTAL HIP ARTHROPLASTY Left 03/05/2024   Procedure: ARTHROPLASTY, HIP, TOTAL,POSTERIOR APPROACH;  Surgeon: Edna Toribio LABOR, MD;  Location: [facility];  Service: Orthopedics;  Laterality: Left;   Patient Active Problem List   Diagnosis Date Noted   Osteoarthritis of left hip 03/05/2024   Severe sleep apnea 01/24/2024   Loud snoring 11/06/2022   Bilateral hip joint arthritis 09/21/2022   Primary hypertension 09/21/2022    PCP: Howell Lunger, DO  REFERRING PROVIDER: Edna Toribio LABOR, MD   REFERRING DIAG: M16.0 (ICD-10-CM) - Bilateral primary osteoarthritis of hip   THERAPY DIAG:  Pain in left hip  Pain in right hip  Muscle weakness (generalized)  Other abnormalities of gait and mobility  Unsteadiness on feet  Rationale for Evaluation and Treatment: Rehabilitation  ONSET DATE: DOS   SUBJECTIVE:   SUBJECTIVE STATEMENT: Pt presents to PT with current 0/10 L hip pain. Has been compliant with HEP.  EVAL: Pt presents to PT today s/p L THA performed by Dr. Edna on 03/05/2024. He arrives late after going to wrong clinic, very fatigued and had to be met in waiting area with PT bringing manual w/c. Had SCFE bilaterally with fixation as teenager, pain and movement  has significantly deteriorated over past few years. L LE has been so pain post surgery that he has not been able to independently move it without gait belt or family assist. R LE is also significantly impaired due to previous SCFE, plan is to replace this hip soon as well.   PERTINENT HISTORY: HTN, Previous hip surgeries post SCFE fx  PAIN:  Are you having pain?  Yes: NPRS scale: 5/10 Worst: 10/10 Pain location: L anterior hip, R lateral hip Pain description: sharp, tight, sore Aggravating factors: standing, walking, transfers Relieving factors: medication  PRECAUTIONS: None  RED FLAGS: None   WEIGHT BEARING RESTRICTIONS: WBAT L LE  FALLS:  Has patient fallen in last 6 months? No  LIVING ENVIRONMENT: Lives with: lives with their family Lives in: House/apartment Stairs: No Has following equipment at home: Walker - 2 wheeled and bed side commode  OCCUPATION: Catering   PLOF: Independent  PATIENT GOALS: improve both hips - be able to do yard work   OBJECTIVE:  Note: Objective measures were completed at Evaluation unless otherwise noted.  DIAGNOSTIC FINDINGS: See imaging   PATIENT SURVEYS:  LEFS: 2/80  COGNITION: Overall cognitive status: Within functional limits for tasks assessed     SENSATION: WFL  POSTURE: rounded shoulders, forward head, and flexed trunk    LOWER EXTREMITY MMT:  MMT Right eval  Left eval  Hip flexion 2+/5   Hip extension    Hip abduction 2+/5   Hip adduction    Hip internal rotation    Hip external rotation    Knee flexion 3+/5 3/5  Knee extension 3+/5 3/5  Ankle dorsiflexion    Ankle plantarflexion    Ankle inversion    Ankle eversion     (Blank rows = not tested)   LE ROM:  ROM Right  Left 7/17  Hip flexion  65 PROM  Hip extension    Hip abduction    Hip adduction    Hip internal rotation    Hip external rotation    Knee extension    Knee flexion    Ankle dorsiflexion    Ankle plantarflexion    Ankle inversion     Ankle eversion     (Blank rows = not tested, N = WNL, * = concordant pain with testing)  FUNCTIONAL TESTS:  30 Second Sit to Stand: 1 reps - Mod A TUG: 58 seconds with FWW - 03/12/2024  GAIT: Distance walked: 23ft Assistive device utilized: Environmental consultant - 2 wheeled - W/C follow Level of assistance: Mod A Comments: decrease WB L LE - decreased R swing, heavy UE reliance   TREATMENT: [facility] Adult PT Treatment:                                                DATE: 04/07/2024 Supine QS x 10 - 5 hold Supine SLR 2x8 L Supine heel slide x 10 L - 5 hold LAQ 2x10 3# L Standing hip abd/ext 2x10 L Standing L hip march x 10 Step ups 2x10 fwd 4in L stance - 1 UE support STS - high table 2x10   PATIENT EDUCATION:  Education details: HEP Person educated: Patient Education method: Explanation, Demonstration, and Handouts Education comprehension: verbalized understanding and returned demonstration  HOME EXERCISE PROGRAM: Access Code: [email or web address] URL: [email or web address] Date: 03/31/2024 Prepared by: Alm Kingdom  Exercises - Supine Quadricep Sets  - 4-5 x daily - 7 x weekly - 2-3 sets - 10 reps - 5 sec hold - Supine Gluteal Sets  - 4-5 x daily - 7 x weekly - 3 sets - 10 reps - 5 sec hold - Supine Ankle Pumps  - 4-5 x daily - 7 x weekly - 3 sets - 20-30 reps - Supine Heel Slide  - 4-5 x daily - 7 x weekly - 2 sets - 10 reps - Supine Hip Abduction  - 4-5 x daily - 7 x weekly - 2 sets - 5 reps - Seated Long Arc Quad  - 1 x daily - 7 x weekly - 2 sets - 10 reps - Supine Straight Leg Raise with Pelvic Floor Contraction  - 1 x daily - 7 x weekly - 3 sets - 10 reps - Supine Straight Leg Raise with Pelvic Floor Contraction  - 1 x daily - 7 x weekly - 3 sets - 5 reps - Standing Hip Abduction with Counter Support  - 1 x daily - 7 x weekly - 2-3 sets - 20 reps - Standing Hip Extension with Counter Support  - 1 x daily - 7 x weekly - 2-3 sets - 20 reps  ASSESSMENT:  CLINICAL  IMPRESSION: Pt was able to complete all prescribed exercises with no adverse effect. Exercises today continue to focus  on progression of activity and rehab post THA. Continues to demo improving functional mobility and progression of standing activity. Pt is progressing well with therapy, will continue per POC.   EVAL: Patient is a 48 y.o. M who was seen today for physical therapy evaluation and treatment s/p L THA performed on 03/05/2024. Assessment today complicated by pt arriving late and complex medical hx. His L hip was extremely painful today and his R LE is significantly limited due to SCFE in childhood. Has to sit with R LE significantly adduction dur to tightness and pain, also unable to support LLE without gait belt. LEFS score shows severe decrease in subjective functional ability below PLOF post op. Pt would benefit from skilled PT services working on improving functional ability and movement post THA.   OBJECTIVE IMPAIRMENTS: Abnormal gait, decreased activity tolerance, decreased mobility, difficulty walking, decreased ROM, decreased strength, impaired flexibility, improper body mechanics, postural dysfunction, and pain  ACTIVITY LIMITATIONS: carrying, lifting, sitting, standing, squatting, sleeping, stairs, transfers, bed mobility, bathing, dressing, and locomotion level  PARTICIPATION LIMITATIONS: meal prep, cleaning, driving, shopping, community activity, occupation, yard work, and school  PERSONAL FACTORS: Fitness, Time since onset of injury/illness/exacerbation, and 3+ comorbidities: HTN, Previous hip surgeries post SCFE fx are also affecting patient's functional outcome.   REHAB POTENTIAL: Good  CLINICAL DECISION MAKING: Evolving/moderate complexity  EVALUATION COMPLEXITY: Moderate   GOALS: Goals reviewed with patient? No  SHORT TERM GOALS: Target date: 03/31/2024   Pt will be compliant and knowledgeable with initial HEP for improved comfort and carryover Baseline: initial  HEP given  Goal status: MET  2.  Pt will self report left hip pain no greater than 7/10 for improved comfort and functional ability Baseline: 10/10 at worst 04/07/2024: 5/10 Goal status: MET   LONG TERM GOALS: Target date: 05/05/2024   Pt will improve LEFS to no less than 20/80 as proxy for functional improvement with home ADLs and higher level community activity Baseline: 2/80 Goal status: INITIAL   2.  Pt will self report left hip pain no greater than 3/10 for improved comfort and functional ability Baseline: 10/10 at worst Goal status: INITIAL   3.  Pt will increase 30 Second Sit to Stand rep count to no less than 5 reps for improved balance, strength, and functional mobility Baseline: 1 reps - heavy UE reliance  Goal status: INITIAL   4.  Pt will decrease TUG by MDC value for improved balance and safety with home and community navigation Baseline: will assess next session 03/12/2024: 58 seconds with FWW Goal status: INITIAL    5.  Pt will be able to ambulate 281ft Mod I with least restrictive assistance device  Baseline:  Goal status: INITIAL   PLAN:  PT FREQUENCY: 2x/week  PT DURATION: 12 weeks  PLANNED INTERVENTIONS: 97110-Therapeutic exercises, 97530- Therapeutic activity, 97112- Neuromuscular re-education, 97535- Self Care, and 02859- Manual therapy  PLAN FOR NEXT SESSION: take vitals, TUG, assess HEP response, attempt lying supine - assess gait tolerance   For all possible CPT codes, reference the Planned Interventions line above.     Check all conditions that are expected to impact treatment: {Conditions expected to impact treatment:Diabetes mellitus and Musculoskeletal disorders   If treatment provided at initial evaluation, no treatment charged due to lack of authorization.       Alm JAYSON Kingdom PT  04/07/24 11:03 AM

## 2024-04-10 ENCOUNTER — Ambulatory Visit

## 2024-04-10 NOTE — Therapy (Deleted)
 OUTPATIENT PHYSICAL THERAPY TREATMENT   Patient Name: Justin Walls MRN: [id number] DOB:[birth year], 48 y.o., male Today's Date: 04/10/2024  END OF SESSION:        Past Medical History:  Diagnosis Date   Arthritis    Asthma    Hypertension    Pre-diabetes    Past Surgical History:  Procedure Laterality Date   HIP SURGERY     hip sx     TOTAL HIP ARTHROPLASTY Left 03/05/2024   Procedure: ARTHROPLASTY, HIP, TOTAL,POSTERIOR APPROACH;  Surgeon: Edna Toribio LABOR, MD;  Location: [facility];  Service: Orthopedics;  Laterality: Left;   Patient Active Problem List   Diagnosis Date Noted   Osteoarthritis of left hip 03/05/2024   Severe sleep apnea 01/24/2024   Loud snoring 11/06/2022   Bilateral hip joint arthritis 09/21/2022   Primary hypertension 09/21/2022    PCP: Howell Lunger, DO  REFERRING PROVIDER: Edna Toribio LABOR, MD   REFERRING DIAG: M16.0 (ICD-10-CM) - Bilateral primary osteoarthritis of hip   THERAPY DIAG:  No diagnosis found.  Rationale for Evaluation and Treatment: Rehabilitation  ONSET DATE: DOS   SUBJECTIVE:   SUBJECTIVE STATEMENT: Pt presents to PT with current 0/10 L hip pain. Has been compliant with HEP.  EVAL: Pt presents to PT today s/p L THA performed by Dr. Edna on 03/05/2024. He arrives late after going to wrong clinic, very fatigued and had to be met in waiting area with PT bringing manual w/c. Had SCFE bilaterally with fixation as teenager, pain and movement has significantly deteriorated over past few years. L LE has been so pain post surgery that he has not been able to independently move it without gait belt or family assist. R LE is also significantly impaired due to previous SCFE, plan is to replace this hip soon as well.   PERTINENT HISTORY: HTN, Previous hip surgeries post SCFE fx  PAIN:  Are you having pain?  Yes: NPRS scale: 5/10 Worst: 10/10 Pain location: L anterior hip, R lateral hip Pain description: sharp, tight,  sore Aggravating factors: standing, walking, transfers Relieving factors: medication  PRECAUTIONS: None  RED FLAGS: None   WEIGHT BEARING RESTRICTIONS: WBAT L LE  FALLS:  Has patient fallen in last 6 months? No  LIVING ENVIRONMENT: Lives with: lives with their family Lives in: House/apartment Stairs: No Has following equipment at home: Walker - 2 wheeled and bed side commode  OCCUPATION: Catering   PLOF: Independent  PATIENT GOALS: improve both hips - be able to do yard work   OBJECTIVE:  Note: Objective measures were completed at Evaluation unless otherwise noted.  DIAGNOSTIC FINDINGS: See imaging   PATIENT SURVEYS:  LEFS: 2/80  COGNITION: Overall cognitive status: Within functional limits for tasks assessed     SENSATION: WFL  POSTURE: rounded shoulders, forward head, and flexed trunk    LOWER EXTREMITY MMT:  MMT Right eval Left eval  Hip flexion 2+/5   Hip extension    Hip abduction 2+/5   Hip adduction    Hip internal rotation    Hip external rotation    Knee flexion 3+/5 3/5  Knee extension 3+/5 3/5  Ankle dorsiflexion    Ankle plantarflexion    Ankle inversion    Ankle eversion     (Blank rows = not tested)   LE ROM:  ROM Right  Left 7/17  Hip flexion  65 PROM  Hip extension    Hip abduction    Hip adduction    Hip internal rotation  Hip external rotation    Knee extension    Knee flexion    Ankle dorsiflexion    Ankle plantarflexion    Ankle inversion    Ankle eversion     (Blank rows = not tested, N = WNL, * = concordant pain with testing)  FUNCTIONAL TESTS:  30 Second Sit to Stand: 1 reps - Mod A TUG: 58 seconds with FWW - 03/12/2024  GAIT: Distance walked: 78ft Assistive device utilized: Environmental consultant - 2 wheeled - W/C follow Level of assistance: Mod A Comments: decrease WB L LE - decreased R swing, heavy UE reliance   TREATMENT: [facility] Adult PT Treatment:                                                DATE:  Supine QS x  10 - 5 hold Supine SLR 2x8 L Supine heel slide x 10 L - 5 hold LAQ 2x10 3# L Standing hip abd/ext 2x10 L Standing L hip march x 10 Step ups 2x10 fwd 4in L stance - 1 UE support STS - high table 2x10   [facility] Adult PT Treatment:                                                DATE:  Supine QS x 10 - 5 hold Supine SLR 2x8 L Supine heel slide x 10 L - 5 hold LAQ 2x10 3# L Standing hip abd/ext 2x10 L Standing L hip march x 10 Step ups 2x10 fwd 4in L stance - 1 UE support STS - high table 2x10   PATIENT EDUCATION:  Education details: HEP Person educated: Patient Education method: Explanation, Demonstration, and Handouts Education comprehension: verbalized understanding and returned demonstration  HOME EXERCISE PROGRAM: Access Code: [email or web address] URL: [email or web address] Date: 03/31/2024 Prepared by: Alm Kingdom  Exercises - Supine Quadricep Sets  - 4-5 x daily - 7 x weekly - 2-3 sets - 10 reps - 5 sec hold - Supine Gluteal Sets  - 4-5 x daily - 7 x weekly - 3 sets - 10 reps - 5 sec hold - Supine Ankle Pumps  - 4-5 x daily - 7 x weekly - 3 sets - 20-30 reps - Supine Heel Slide  - 4-5 x daily - 7 x weekly - 2 sets - 10 reps - Supine Hip Abduction  - 4-5 x daily - 7 x weekly - 2 sets - 5 reps - Seated Long Arc Quad  - 1 x daily - 7 x weekly - 2 sets - 10 reps - Supine Straight Leg Raise with Pelvic Floor Contraction  - 1 x daily - 7 x weekly - 3 sets - 10 reps - Supine Straight Leg Raise with Pelvic Floor Contraction  - 1 x daily - 7 x weekly - 3 sets - 5 reps - Standing Hip Abduction with Counter Support  - 1 x daily - 7 x weekly - 2-3 sets - 20 reps - Standing Hip Extension with Counter Support  - 1 x daily - 7 x weekly - 2-3 sets - 20 reps  ASSESSMENT:  CLINICAL IMPRESSION: ***  Pt was able to complete all prescribed exercises with no adverse effect. Exercises today continue to focus on progression  of activity and rehab post THA. Continues to demo improving  functional mobility and progression of standing activity. Pt is progressing well with therapy, will continue per POC.   EVAL: Patient is a 48 y.o. M who was seen today for physical therapy evaluation and treatment s/p L THA performed on 03/05/2024. Assessment today complicated by pt arriving late and complex medical hx. His L hip was extremely painful today and his R LE is significantly limited due to SCFE in childhood. Has to sit with R LE significantly adduction dur to tightness and pain, also unable to support LLE without gait belt. LEFS score shows severe decrease in subjective functional ability below PLOF post op. Pt would benefit from skilled PT services working on improving functional ability and movement post THA.   OBJECTIVE IMPAIRMENTS: Abnormal gait, decreased activity tolerance, decreased mobility, difficulty walking, decreased ROM, decreased strength, impaired flexibility, improper body mechanics, postural dysfunction, and pain  ACTIVITY LIMITATIONS: carrying, lifting, sitting, standing, squatting, sleeping, stairs, transfers, bed mobility, bathing, dressing, and locomotion level  PARTICIPATION LIMITATIONS: meal prep, cleaning, driving, shopping, community activity, occupation, yard work, and school  PERSONAL FACTORS: Fitness, Time since onset of injury/illness/exacerbation, and 3+ comorbidities: HTN, Previous hip surgeries post SCFE fx are also affecting patient's functional outcome.   REHAB POTENTIAL: Good  CLINICAL DECISION MAKING: Evolving/moderate complexity  EVALUATION COMPLEXITY: Moderate   GOALS: Goals reviewed with patient? No  SHORT TERM GOALS: Target date: 03/31/2024   Pt will be compliant and knowledgeable with initial HEP for improved comfort and carryover Baseline: initial HEP given  Goal status: MET  2.  Pt will self report left hip pain no greater than 7/10 for improved comfort and functional ability Baseline: 10/10 at worst 04/07/2024: 5/10 Goal status: MET    LONG TERM GOALS: Target date: 05/05/2024   Pt will improve LEFS to no less than 20/80 as proxy for functional improvement with home ADLs and higher level community activity Baseline: 2/80 Goal status: INITIAL   2.  Pt will self report left hip pain no greater than 3/10 for improved comfort and functional ability Baseline: 10/10 at worst Goal status: INITIAL   3.  Pt will increase 30 Second Sit to Stand rep count to no less than 5 reps for improved balance, strength, and functional mobility Baseline: 1 reps - heavy UE reliance  Goal status: INITIAL   4.  Pt will decrease TUG by MDC value for improved balance and safety with home and community navigation Baseline: will assess next session 03/12/2024: 58 seconds with FWW Goal status: INITIAL    5.  Pt will be able to ambulate 270ft Mod I with least restrictive assistance device  Baseline:  Goal status: INITIAL   PLAN:  PT FREQUENCY: 2x/week  PT DURATION: 12 weeks  PLANNED INTERVENTIONS: 97110-Therapeutic exercises, 97530- Therapeutic activity, 97112- Neuromuscular re-education, 97535- Self Care, and 02859- Manual therapy  PLAN FOR NEXT SESSION: take vitals, TUG, assess HEP response, attempt lying supine - assess gait tolerance   For all possible CPT codes, reference the Planned Interventions line above.     Check all conditions that are expected to impact treatment: {Conditions expected to impact treatment:Diabetes mellitus and Musculoskeletal disorders   If treatment provided at initial evaluation, no treatment charged due to lack of authorization.       ***

## 2024-04-21 ENCOUNTER — Encounter: Payer: Self-pay | Admitting: Physical Therapy

## 2024-04-21 ENCOUNTER — Ambulatory Visit: Attending: Orthopedic Surgery | Admitting: Physical Therapy

## 2024-04-21 DIAGNOSIS — M25552 Pain in left hip: Secondary | ICD-10-CM | POA: Diagnosis present

## 2024-04-21 DIAGNOSIS — R2689 Other abnormalities of gait and mobility: Secondary | ICD-10-CM | POA: Diagnosis present

## 2024-04-21 DIAGNOSIS — R2681 Unsteadiness on feet: Secondary | ICD-10-CM | POA: Insufficient documentation

## 2024-04-21 DIAGNOSIS — M6281 Muscle weakness (generalized): Secondary | ICD-10-CM | POA: Insufficient documentation

## 2024-04-21 DIAGNOSIS — M25551 Pain in right hip: Secondary | ICD-10-CM | POA: Diagnosis present

## 2024-04-21 NOTE — Therapy (Signed)
 OUTPATIENT PHYSICAL THERAPY TREATMENT   Patient Name: Justin Walls MRN: [id number] DOB:03-Jul-1976, 48 y.o., male Today's Date: 04/21/2024  END OF SESSION:  PT End of Session - 04/21/24 0858     Visit Number 9    Number of Visits 20    Date for PT Re-Evaluation 05/06/24    Authorization Type [place] [employer]    PT Start Time 0900   pt arrived late   PT Stop Time 0925    PT Time Calculation (min) 25 min    Activity Tolerance Patient tolerated treatment well    Behavior During Therapy [facility] for tasks assessed/performed               Past Medical History:  Diagnosis Date   Arthritis    Asthma    Hypertension    Pre-diabetes    Past Surgical History:  Procedure Laterality Date   HIP SURGERY     hip sx     TOTAL HIP ARTHROPLASTY Left 03/05/2024   Procedure: ARTHROPLASTY, HIP, TOTAL,POSTERIOR APPROACH;  Surgeon: Edna Toribio LABOR, MD;  Location: [facility];  Service: Orthopedics;  Laterality: Left;   Patient Active Problem List   Diagnosis Date Noted   Osteoarthritis of left hip 03/05/2024   Severe sleep apnea 01/24/2024   Loud snoring 11/06/2022   Bilateral hip joint arthritis 09/21/2022   Primary hypertension 09/21/2022    PCP: Howell Lunger, DO  REFERRING PROVIDER: Edna Toribio LABOR, MD   REFERRING DIAG: M16.0 (ICD-10-CM) - Bilateral primary osteoarthritis of hip   THERAPY DIAG:  Pain in left hip  Pain in right hip  Muscle weakness (generalized)  Rationale for Evaluation and Treatment: Rehabilitation  ONSET DATE: DOS   SUBJECTIVE:   SUBJECTIVE STATEMENT: Pt presents to PT with current 0/10 L hip pain. Has been compliant with HEP.  EVAL: Pt presents to PT today s/p L THA performed by Dr. Edna on 03/05/2024. He arrives late after going to wrong clinic, very fatigued and had to be met in waiting area with PT bringing manual w/c. Had SCFE bilaterally with fixation as teenager, pain and movement has significantly deteriorated over past few  years. L LE has been so pain post surgery that he has not been able to independently move it without gait belt or family assist. R LE is also significantly impaired due to previous SCFE, plan is to replace this hip soon as well.   PERTINENT HISTORY: HTN, Previous hip surgeries post SCFE fx  PAIN:  Are you having pain?  Yes: NPRS scale: 5/10 Worst: 10/10 Pain location: L anterior hip, R lateral hip Pain description: sharp, tight, sore Aggravating factors: standing, walking, transfers Relieving factors: medication  PRECAUTIONS: None  RED FLAGS: None   WEIGHT BEARING RESTRICTIONS: WBAT L LE  FALLS:  Has patient fallen in last 6 months? No  LIVING ENVIRONMENT: Lives with: lives with their family Lives in: House/apartment Stairs: No Has following equipment at home: Walker - 2 wheeled and bed side commode  OCCUPATION: Catering   PLOF: Independent  PATIENT GOALS: improve both hips - be able to do yard work   OBJECTIVE:  Note: Objective measures were completed at Evaluation unless otherwise noted.  DIAGNOSTIC FINDINGS: See imaging   PATIENT SURVEYS:  LEFS: 2/80  COGNITION: Overall cognitive status: Within functional limits for tasks assessed     SENSATION: WFL  POSTURE: rounded shoulders, forward head, and flexed trunk    LOWER EXTREMITY MMT:  MMT Right eval Left eval  Hip flexion 2+/5  Hip extension    Hip abduction 2+/5   Hip adduction    Hip internal rotation    Hip external rotation    Knee flexion 3+/5 3/5  Knee extension 3+/5 3/5  Ankle dorsiflexion    Ankle plantarflexion    Ankle inversion    Ankle eversion     (Blank rows = not tested)   LE ROM:  ROM Right  Left 7/17  Hip flexion  65 PROM  Hip extension    Hip abduction    Hip adduction    Hip internal rotation    Hip external rotation    Knee extension    Knee flexion    Ankle dorsiflexion    Ankle plantarflexion    Ankle inversion    Ankle eversion     (Blank rows = not  tested, N = WNL, * = concordant pain with testing)  FUNCTIONAL TESTS:  30 Second Sit to Stand: 1 reps - Mod A TUG: 58 seconds with FWW - 03/12/2024  GAIT: Distance walked: 48ft Assistive device utilized: Environmental consultant - 2 wheeled - W/C follow Level of assistance: Mod A Comments: decrease WB L LE - decreased R swing, heavy UE reliance   TREATMENT: [facility] Adult PT Treatment:                                                DATE: 04/21/2024  Therex: Single bent knee fall out - 20x Supine SLR 2x8 L Supine heel slide x 10 L - 5 hold LAQ 2x10 5# L  Theract: Standing hip abd/ext 2x10 L Standing L hip march x 10 Step ups 2x10 fwd 4in L stance - 1 UE support STS - high table 2x10     HOME EXERCISE PROGRAM: Access Code: [email or web address] URL: [email or web address] Date: 03/31/2024 Prepared by: Alm Kingdom  Exercises - Supine Quadricep Sets  - 4-5 x daily - 7 x weekly - 2-3 sets - 10 reps - 5 sec hold - Supine Gluteal Sets  - 4-5 x daily - 7 x weekly - 3 sets - 10 reps - 5 sec hold - Supine Ankle Pumps  - 4-5 x daily - 7 x weekly - 3 sets - 20-30 reps - Supine Heel Slide  - 4-5 x daily - 7 x weekly - 2 sets - 10 reps - Supine Hip Abduction  - 4-5 x daily - 7 x weekly - 2 sets - 5 reps - Seated Long Arc Quad  - 1 x daily - 7 x weekly - 2 sets - 10 reps - Supine Straight Leg Raise with Pelvic Floor Contraction  - 1 x daily - 7 x weekly - 3 sets - 10 reps - Supine Straight Leg Raise with Pelvic Floor Contraction  - 1 x daily - 7 x weekly - 3 sets - 5 reps - Standing Hip Abduction with Counter Support  - 1 x daily - 7 x weekly - 2-3 sets - 20 reps - Standing Hip Extension with Counter Support  - 1 x daily - 7 x weekly - 2-3 sets - 20 reps  ASSESSMENT:  CLINICAL IMPRESSION: Pt arrived late so visit was truncated.  Pt continues to endorse L hip stiffness and weakness which is improving with therapy.  Worked on building comfort with standing exercises today to good effect.  Pt cued to  maintain upright posture and  limit lateral trunk movement during hip abd.  Pt notes fatigue with standing exercises but minimal increase in baseline pain with exception of R hip flexion.  R hip flexion ROM reduced with improved comfort reported.  EVAL: Patient is a 48 y.o. M who was seen today for physical therapy evaluation and treatment s/p L THA performed on 03/05/2024. Assessment today complicated by pt arriving late and complex medical hx. His L hip was extremely painful today and his R LE is significantly limited due to SCFE in childhood. Has to sit with R LE significantly adduction dur to tightness and pain, also unable to support LLE without gait belt. LEFS score shows severe decrease in subjective functional ability below PLOF post op. Pt would benefit from skilled PT services working on improving functional ability and movement post THA.   OBJECTIVE IMPAIRMENTS: Abnormal gait, decreased activity tolerance, decreased mobility, difficulty walking, decreased ROM, decreased strength, impaired flexibility, improper body mechanics, postural dysfunction, and pain  ACTIVITY LIMITATIONS: carrying, lifting, sitting, standing, squatting, sleeping, stairs, transfers, bed mobility, bathing, dressing, and locomotion level  PARTICIPATION LIMITATIONS: meal prep, cleaning, driving, shopping, community activity, occupation, yard work, and school  PERSONAL FACTORS: Fitness, Time since onset of injury/illness/exacerbation, and 3+ comorbidities: HTN, Previous hip surgeries post SCFE fx are also affecting patient's functional outcome.   REHAB POTENTIAL: Good  CLINICAL DECISION MAKING: Evolving/moderate complexity  EVALUATION COMPLEXITY: Moderate   GOALS: Goals reviewed with patient? No  SHORT TERM GOALS: Target date: 03/31/2024   Pt will be compliant and knowledgeable with initial HEP for improved comfort and carryover Baseline: initial HEP given  Goal status: MET  2.  Pt will self report left hip pain  no greater than 7/10 for improved comfort and functional ability Baseline: 10/10 at worst 04/07/2024: 5/10 Goal status: MET   LONG TERM GOALS: Target date: 05/05/2024   Pt will improve LEFS to no less than 20/80 as proxy for functional improvement with home ADLs and higher level community activity Baseline: 2/80 Goal status: INITIAL   2.  Pt will self report left hip pain no greater than 3/10 for improved comfort and functional ability Baseline: 10/10 at worst Goal status: INITIAL   3.  Pt will increase 30 Second Sit to Stand rep count to no less than 5 reps for improved balance, strength, and functional mobility Baseline: 1 reps - heavy UE reliance  Goal status: INITIAL   4.  Pt will decrease TUG by MDC value for improved balance and safety with home and community navigation Baseline: will assess next session 03/12/2024: 58 seconds with FWW Goal status: INITIAL    5.  Pt will be able to ambulate 240ft Mod I with least restrictive assistance device  Baseline:  Goal status: INITIAL   PLAN:  PT FREQUENCY: 2x/week  PT DURATION: 12 weeks  PLANNED INTERVENTIONS: 97110-Therapeutic exercises, 97530- Therapeutic activity, 97112- Neuromuscular re-education, 97535- Self Care, and 02859- Manual therapy  PLAN FOR NEXT SESSION: take vitals, TUG, assess HEP response, attempt lying supine - assess gait tolerance   For all possible CPT codes, reference the Planned Interventions line above.     Check all conditions that are expected to impact treatment: {Conditions expected to impact treatment:Diabetes mellitus and Musculoskeletal disorders   If treatment provided at initial evaluation, no treatment charged due to lack of authorization.

## 2024-04-23 ENCOUNTER — Encounter: Payer: Self-pay | Admitting: Physical Therapy

## 2024-04-23 ENCOUNTER — Ambulatory Visit: Admitting: Physical Therapy

## 2024-04-23 DIAGNOSIS — M25552 Pain in left hip: Secondary | ICD-10-CM | POA: Diagnosis not present

## 2024-04-23 DIAGNOSIS — M25551 Pain in right hip: Secondary | ICD-10-CM

## 2024-04-23 DIAGNOSIS — M6281 Muscle weakness (generalized): Secondary | ICD-10-CM

## 2024-04-23 NOTE — Therapy (Signed)
 OUTPATIENT PHYSICAL THERAPY TREATMENT   Patient Name: Justin Walls MRN: [id number] DOB:Oct 30, 1975, 48 y.o., male Today's Date: 04/23/2024  END OF SESSION:  PT End of Session - 04/23/24 1038     Visit Number 10    Number of Visits 20    Date for PT Re-Evaluation 05/06/24    Authorization Type [place] [employer]    Authorization - Visit Number 10    Authorization - Number of Visits 27    PT Start Time [phone]   pt arrived late   PT Stop Time 1032    PT Time Calculation (min) 50 min    Activity Tolerance Patient tolerated treatment well                Past Medical History:  Diagnosis Date   Arthritis    Asthma    Hypertension    Pre-diabetes    Past Surgical History:  Procedure Laterality Date   HIP SURGERY     hip sx     TOTAL HIP ARTHROPLASTY Left 03/05/2024   Procedure: ARTHROPLASTY, HIP, TOTAL,POSTERIOR APPROACH;  Surgeon: Edna Toribio LABOR, MD;  Location: [facility];  Service: Orthopedics;  Laterality: Left;   Patient Active Problem List   Diagnosis Date Noted   Osteoarthritis of left hip 03/05/2024   Severe sleep apnea 01/24/2024   Loud snoring 11/06/2022   Bilateral hip joint arthritis 09/21/2022   Primary hypertension 09/21/2022    PCP: Howell Lunger, DO  REFERRING PROVIDER: Edna Toribio LABOR, MD   REFERRING DIAG: M16.0 (ICD-10-CM) - Bilateral primary osteoarthritis of hip   THERAPY DIAG:  Pain in left hip  Pain in right hip  Muscle weakness (generalized)  Rationale for Evaluation and Treatment: Rehabilitation  ONSET DATE: DOS   SUBJECTIVE:   SUBJECTIVE STATEMENT: 04/23/2024  pt report no pain   EVAL: Pt presents to PT today s/p L THA performed by Dr. Edna on 03/05/2024. He arrives late after going to wrong clinic, very fatigued and had to be met in waiting area with PT bringing manual w/c. Had SCFE bilaterally with fixation as teenager, pain and movement has significantly deteriorated over past few years. L LE has been so pain  post surgery that he has not been able to independently move it without gait belt or family assist. R LE is also significantly impaired due to previous SCFE, plan is to replace this hip soon as well.   PERTINENT HISTORY: HTN, Previous hip surgeries post SCFE fx  PAIN:  Are you having pain?  Yes: NPRS scale: 5/10 Worst: 10/10 Pain location: L anterior hip, R lateral hip Pain description: sharp, tight, sore Aggravating factors: standing, walking, transfers Relieving factors: medication  PRECAUTIONS: None  RED FLAGS: None   WEIGHT BEARING RESTRICTIONS: WBAT L LE  FALLS:  Has patient fallen in last 6 months? No  LIVING ENVIRONMENT: Lives with: lives with their family Lives in: House/apartment Stairs: No Has following equipment at home: Walker - 2 wheeled and bed side commode  OCCUPATION: Catering   PLOF: Independent  PATIENT GOALS: improve both hips - be able to do yard work   OBJECTIVE:  Note: Objective measures were completed at Evaluation unless otherwise noted.  DIAGNOSTIC FINDINGS: See imaging   PATIENT SURVEYS:  LEFS: 2/80  COGNITION: Overall cognitive status: Within functional limits for tasks assessed     SENSATION: WFL  POSTURE: rounded shoulders, forward head, and flexed trunk    LOWER EXTREMITY MMT:  MMT Right eval Left eval  Hip flexion 2+/5  Hip extension    Hip abduction 2+/5   Hip adduction    Hip internal rotation    Hip external rotation    Knee flexion 3+/5 3/5  Knee extension 3+/5 3/5  Ankle dorsiflexion    Ankle plantarflexion    Ankle inversion    Ankle eversion     (Blank rows = not tested)   LE ROM:  ROM Right  Left 7/17  Hip flexion  65 PROM  Hip extension    Hip abduction    Hip adduction    Hip internal rotation    Hip external rotation    Knee extension    Knee flexion    Ankle dorsiflexion    Ankle plantarflexion    Ankle inversion    Ankle eversion     (Blank rows = not tested, N = WNL, * = concordant  pain with testing)  FUNCTIONAL TESTS:  30 Second Sit to Stand: 1 reps - Mod A TUG: 58 seconds with FWW - 03/12/2024  GAIT: Distance walked: 55ft Assistive device utilized: Environmental consultant - 2 wheeled - W/C follow Level of assistance: Mod A Comments: decrease WB L LE - decreased R swing, heavy UE reliance   TREATMENT: [facility] Adult PT Treatment:                                                DATE: 04/23/24 Nu-step l 5 x 5 min LE only - halted due to R hip pain/ limited ROM Supine hip flexion with heels on physioball 2 x 10 Heel slide L 1 x 15, R 2 x 5 AAROM with strap Isometric quad and glute squeeze on R/ L 1 x 5 holding 5 seconds MTPR along the R hip abductor/ flexors and rectus femoris Sit to stand from elevated table with hands on knees utilizing forward rock 2 x 10 L LLE seated marching 2 x 10 LLE LAQ with GTB 1 x 15   [facility] Adult PT Treatment:                                                DATE: 04/21/24  Therex: Single bent knee fall out - 20x Supine SLR 2x8 L Supine heel slide x 10 L - 5 hold LAQ 2x10 5# L  Theract: Standing hip abd/ext 2x10 L Standing L hip march x 10 Step ups 2x10 fwd 4in L stance - 1 UE support STS - high table 2x10     HOME EXERCISE PROGRAM: Access Code: [email or web address] URL: [email or web address] Date: 03/31/2024 Prepared by: Alm Kingdom  Exercises - Supine Quadricep Sets  - 4-5 x daily - 7 x weekly - 2-3 sets - 10 reps - 5 sec hold - Supine Gluteal Sets  - 4-5 x daily - 7 x weekly - 3 sets - 10 reps - 5 sec hold - Supine Ankle Pumps  - 4-5 x daily - 7 x weekly - 3 sets - 20-30 reps - Supine Heel Slide  - 4-5 x daily - 7 x weekly - 2 sets - 10 reps - Supine Hip Abduction  - 4-5 x daily - 7 x weekly - 2 sets - 5 reps - Seated Long Arc Quad  - 1 x daily -  7 x weekly - 2 sets - 10 reps - Supine Straight Leg Raise with Pelvic Floor Contraction  - 1 x daily - 7 x weekly - 3 sets - 10 reps - Supine Straight Leg Raise with Pelvic Floor Contraction  -  1 x daily - 7 x weekly - 3 sets - 5 reps - Standing Hip Abduction with Counter Support  - 1 x daily - 7 x weekly - 2-3 sets - 20 reps - Standing Hip Extension with Counter Support  - 1 x daily - 7 x weekly - 2-3 sets - 20 reps  ASSESSMENT:  CLINICAL IMPRESSION: Mr Gains arrives to session reporting no L hip pain today but the R hip continues to challenge his function with limited ROM, pain and stiffness. Continued working on LLE strengthening and some RLE activation while staying within his pain tolerance. Discussed benefits of muscle activaiton the R to help support the left and when he plans to have his R hip done.   EVAL: Patient is a 48 y.o. M who was seen today for physical therapy evaluation and treatment s/p L THA performed on 03/05/2024. Assessment today complicated by pt arriving late and complex medical hx. His L hip was extremely painful today and his R LE is significantly limited due to SCFE in childhood. Has to sit with R LE significantly adduction dur to tightness and pain, also unable to support LLE without gait belt. LEFS score shows severe decrease in subjective functional ability below PLOF post op. Pt would benefit from skilled PT services working on improving functional ability and movement post THA.   OBJECTIVE IMPAIRMENTS: Abnormal gait, decreased activity tolerance, decreased mobility, difficulty walking, decreased ROM, decreased strength, impaired flexibility, improper body mechanics, postural dysfunction, and pain  ACTIVITY LIMITATIONS: carrying, lifting, sitting, standing, squatting, sleeping, stairs, transfers, bed mobility, bathing, dressing, and locomotion level  PARTICIPATION LIMITATIONS: meal prep, cleaning, driving, shopping, community activity, occupation, yard work, and school  PERSONAL FACTORS: Fitness, Time since onset of injury/illness/exacerbation, and 3+ comorbidities: HTN, Previous hip surgeries post SCFE fx are also affecting patient's functional outcome.    REHAB POTENTIAL: Good  CLINICAL DECISION MAKING: Evolving/moderate complexity  EVALUATION COMPLEXITY: Moderate   GOALS: Goals reviewed with patient? No  SHORT TERM GOALS: Target date: 03/31/2024   Pt will be compliant and knowledgeable with initial HEP for improved comfort and carryover Baseline: initial HEP given  Goal status: MET  2.  Pt will self report left hip pain no greater than 7/10 for improved comfort and functional ability Baseline: 10/10 at worst 04/07/2024: 5/10 Goal status: MET   LONG TERM GOALS: Target date: 05/05/2024   Pt will improve LEFS to no less than 20/80 as proxy for functional improvement with home ADLs and higher level community activity Baseline: 2/80 Goal status: INITIAL   2.  Pt will self report left hip pain no greater than 3/10 for improved comfort and functional ability Baseline: 10/10 at worst Goal status: INITIAL   3.  Pt will increase 30 Second Sit to Stand rep count to no less than 5 reps for improved balance, strength, and functional mobility Baseline: 1 reps - heavy UE reliance  Goal status: INITIAL   4.  Pt will decrease TUG by MDC value for improved balance and safety with home and community navigation Baseline: will assess next session 03/12/2024: 58 seconds with FWW Goal status: INITIAL    5.  Pt will be able to ambulate 233ft Mod I with least restrictive assistance device  Baseline:  Goal status: INITIAL   PLAN:  PT FREQUENCY: 2x/week  PT DURATION: 12 weeks  PLANNED INTERVENTIONS: 97110-Therapeutic exercises, 97530- Therapeutic activity, 97112- Neuromuscular re-education, 97535- Self Care, and 02859- Manual therapy  PLAN FOR NEXT SESSION: take vitals, TUG, assess HEP response, attempt lying supine - assess gait tolerance   For all possible CPT codes, reference the Planned Interventions line above.     Check all conditions that are expected to impact treatment: {Conditions expected to impact treatment:Diabetes mellitus  and Musculoskeletal disorders   If treatment provided at initial evaluation, no treatment charged due to lack of authorization.

## 2024-04-28 ENCOUNTER — Ambulatory Visit

## 2024-04-28 DIAGNOSIS — M25551 Pain in right hip: Secondary | ICD-10-CM

## 2024-04-28 DIAGNOSIS — R2681 Unsteadiness on feet: Secondary | ICD-10-CM

## 2024-04-28 DIAGNOSIS — R2689 Other abnormalities of gait and mobility: Secondary | ICD-10-CM

## 2024-04-28 DIAGNOSIS — M25552 Pain in left hip: Secondary | ICD-10-CM

## 2024-04-28 DIAGNOSIS — M6281 Muscle weakness (generalized): Secondary | ICD-10-CM

## 2024-04-28 NOTE — Therapy (Signed)
 OUTPATIENT PHYSICAL THERAPY TREATMENT   Patient Name: Justin Walls MRN: [id number] DOB:1976/01/10, 48 y.o., male Today's Date: 04/28/2024  END OF SESSION:  PT End of Session - 04/28/24 1023     Visit Number 11    Number of Visits 20    Date for PT Re-Evaluation 05/06/24    Authorization Type [place] [employer]    Authorization - Visit Number 11    Authorization - Number of Visits 27    PT Start Time 1024    PT Stop Time 1104    PT Time Calculation (min) 40 min    Activity Tolerance Patient tolerated treatment well                 Past Medical History:  Diagnosis Date   Arthritis    Asthma    Hypertension    Pre-diabetes    Past Surgical History:  Procedure Laterality Date   HIP SURGERY     hip sx     TOTAL HIP ARTHROPLASTY Left 03/05/2024   Procedure: ARTHROPLASTY, HIP, TOTAL,POSTERIOR APPROACH;  Surgeon: Edna Toribio LABOR, MD;  Location: [facility];  Service: Orthopedics;  Laterality: Left;   Patient Active Problem List   Diagnosis Date Noted   Osteoarthritis of left hip 03/05/2024   Severe sleep apnea 01/24/2024   Loud snoring 11/06/2022   Bilateral hip joint arthritis 09/21/2022   Primary hypertension 09/21/2022    PCP: Howell Lunger, DO  REFERRING PROVIDER: Edna Toribio LABOR, MD   REFERRING DIAG: M16.0 (ICD-10-CM) - Bilateral primary osteoarthritis of hip   THERAPY DIAG:  Pain in left hip  Pain in right hip  Muscle weakness (generalized)  Other abnormalities of gait and mobility  Unsteadiness on feet  Rationale for Evaluation and Treatment: Rehabilitation  ONSET DATE: DOS   SUBJECTIVE:   SUBJECTIVE STATEMENT: Pt presents to PT with reports of L hip stiffness. Has R hip surgery scheduled for 06/25/24.   EVAL: Pt presents to PT today s/p L THA performed by Dr. Edna on 03/05/2024. He arrives late after going to wrong clinic, very fatigued and had to be met in waiting area with PT bringing manual w/c. Had SCFE bilaterally with  fixation as teenager, pain and movement has significantly deteriorated over past few years. L LE has been so pain post surgery that he has not been able to independently move it without gait belt or family assist. R LE is also significantly impaired due to previous SCFE, plan is to replace this hip soon as well.   PERTINENT HISTORY: HTN, Previous hip surgeries post SCFE fx  PAIN:  Are you having pain?  Yes: NPRS scale: 5/10 Worst: 10/10 Pain location: L anterior hip, R lateral hip Pain description: sharp, tight, sore Aggravating factors: standing, walking, transfers Relieving factors: medication  PRECAUTIONS: None  RED FLAGS: None   WEIGHT BEARING RESTRICTIONS: WBAT L LE  FALLS:  Has patient fallen in last 6 months? No  LIVING ENVIRONMENT: Lives with: lives with their family Lives in: House/apartment Stairs: No Has following equipment at home: Walker - 2 wheeled and bed side commode  OCCUPATION: Catering   PLOF: Independent  PATIENT GOALS: improve both hips - be able to do yard work   OBJECTIVE:  Note: Objective measures were completed at Evaluation unless otherwise noted.  DIAGNOSTIC FINDINGS: See imaging   PATIENT SURVEYS:  LEFS: 2/80  COGNITION: Overall cognitive status: Within functional limits for tasks assessed     SENSATION: WFL  POSTURE: rounded shoulders, forward head,  and flexed trunk    LOWER EXTREMITY MMT:  MMT Right eval Left eval  Hip flexion 2+/5   Hip extension    Hip abduction 2+/5   Hip adduction    Hip internal rotation    Hip external rotation    Knee flexion 3+/5 3/5  Knee extension 3+/5 3/5  Ankle dorsiflexion    Ankle plantarflexion    Ankle inversion    Ankle eversion     (Blank rows = not tested)   LE ROM:  ROM Right  Left 7/17  Hip flexion  65 PROM  Hip extension    Hip abduction    Hip adduction    Hip internal rotation    Hip external rotation    Knee extension    Knee flexion    Ankle dorsiflexion     Ankle plantarflexion    Ankle inversion    Ankle eversion     (Blank rows = not tested, N = WNL, * = concordant pain with testing)  FUNCTIONAL TESTS:  30 Second Sit to Stand: 1 reps - Mod A TUG: 58 seconds with FWW - 03/12/2024  GAIT: Distance walked: 46ft Assistive device utilized: Environmental consultant - 2 wheeled - W/C follow Level of assistance: Mod A Comments: decrease WB L LE - decreased R swing, heavy UE reliance   TREATMENT: [facility] Adult PT Treatment:                                                DATE: 04/28/24 STS from elevated table x 10 - no UE support Standing hip abd/ext 2x10 L 2.5# Standing march x 20 L - in available range LAQ 3x10 4# L only SLR x 10 L - bolster under R knee for comfort Supine heel slide 2x10 - 5 hold S/L hip abd 2x10 L Prone hamstring curl 2x10 4# L  [facility] Adult PT Treatment:                                                DATE: 04/23/24 Nu-step l 5 x 5 min LE only - halted due to R hip pain/ limited ROM Supine hip flexion with heels on physioball 2 x 10 Heel slide L 1 x 15, R 2 x 5 AAROM with strap Isometric quad and glute squeeze on R/ L 1 x 5 holding 5 seconds MTPR along the R hip abductor/ flexors and rectus femoris Sit to stand from elevated table with hands on knees utilizing forward rock 2 x 10 L LLE seated marching 2 x 10 LLE LAQ with GTB 1 x 15   HOME EXERCISE PROGRAM: Access Code: [email or web address] URL: [email or web address] Date: 03/31/2024 Prepared by: Alm Kingdom  Exercises - Supine Quadricep Sets  - 4-5 x daily - 7 x weekly - 2-3 sets - 10 reps - 5 sec hold - Supine Gluteal Sets  - 4-5 x daily - 7 x weekly - 3 sets - 10 reps - 5 sec hold - Supine Ankle Pumps  - 4-5 x daily - 7 x weekly - 3 sets - 20-30 reps - Supine Heel Slide  - 4-5 x daily - 7 x weekly - 2 sets - 10 reps - Supine Hip Abduction  - 4-5 x  daily - 7 x weekly - 2 sets - 5 reps - Seated Long Arc Quad  - 1 x daily - 7 x weekly - 2 sets - 10 reps - Supine Straight Leg  Raise with Pelvic Floor Contraction  - 1 x daily - 7 x weekly - 3 sets - 10 reps - Supine Straight Leg Raise with Pelvic Floor Contraction  - 1 x daily - 7 x weekly - 3 sets - 5 reps - Standing Hip Abduction with Counter Support  - 1 x daily - 7 x weekly - 2-3 sets - 20 reps - Standing Hip Extension with Counter Support  - 1 x daily - 7 x weekly - 2-3 sets - 20 reps  ASSESSMENT:  CLINICAL IMPRESSION: Pt was able to complete all prescribed exercises with no adverse effect. Today we continued to progress L hip strength, ROM, and functional mobility post THA. We discussed that it would be best to decrease appointments to 1x/wk to conservce remaining yearly visit limit to prepare for his upcoming R THA on 06/25/24. L hip has progressed nicely compared to initial post op disposition, will continue per POC.   EVAL: Patient is a 48 y.o. M who was seen today for physical therapy evaluation and treatment s/p L THA performed on 03/05/2024. Assessment today complicated by pt arriving late and complex medical hx. His L hip was extremely painful today and his R LE is significantly limited due to SCFE in childhood. Has to sit with R LE significantly adduction dur to tightness and pain, also unable to support LLE without gait belt. LEFS score shows severe decrease in subjective functional ability below PLOF post op. Pt would benefit from skilled PT services working on improving functional ability and movement post THA.   OBJECTIVE IMPAIRMENTS: Abnormal gait, decreased activity tolerance, decreased mobility, difficulty walking, decreased ROM, decreased strength, impaired flexibility, improper body mechanics, postural dysfunction, and pain  ACTIVITY LIMITATIONS: carrying, lifting, sitting, standing, squatting, sleeping, stairs, transfers, bed mobility, bathing, dressing, and locomotion level  PARTICIPATION LIMITATIONS: meal prep, cleaning, driving, shopping, community activity, occupation, yard work, and  school  PERSONAL FACTORS: Fitness, Time since onset of injury/illness/exacerbation, and 3+ comorbidities: HTN, Previous hip surgeries post SCFE fx are also affecting patient's functional outcome.   REHAB POTENTIAL: Good  CLINICAL DECISION MAKING: Evolving/moderate complexity  EVALUATION COMPLEXITY: Moderate   GOALS: Goals reviewed with patient? No  SHORT TERM GOALS: Target date: 03/31/2024   Pt will be compliant and knowledgeable with initial HEP for improved comfort and carryover Baseline: initial HEP given  Goal status: MET  2.  Pt will self report left hip pain no greater than 7/10 for improved comfort and functional ability Baseline: 10/10 at worst 04/07/2024: 5/10 Goal status: MET   LONG TERM GOALS: Target date: 05/05/2024   Pt will improve LEFS to no less than 20/80 as proxy for functional improvement with home ADLs and higher level community activity Baseline: 2/80 Goal status: INITIAL   2.  Pt will self report left hip pain no greater than 3/10 for improved comfort and functional ability Baseline: 10/10 at worst Goal status: INITIAL   3.  Pt will increase 30 Second Sit to Stand rep count to no less than 5 reps for improved balance, strength, and functional mobility Baseline: 1 reps - heavy UE reliance  Goal status: INITIAL   4.  Pt will decrease TUG by MDC value for improved balance and safety with home and community navigation Baseline: will assess next session 03/12/2024:  58 seconds with FWW Goal status: INITIAL    5.  Pt will be able to ambulate 219ft Mod I with least restrictive assistance device  Baseline:  Goal status: INITIAL   PLAN:  PT FREQUENCY: 2x/week  PT DURATION: 12 weeks  PLANNED INTERVENTIONS: 97110-Therapeutic exercises, 97530- Therapeutic activity, [id number]- Neuromuscular re-education, 97535- Self Care, and 02859- Manual therapy  PLAN FOR NEXT SESSION: take vitals, TUG, assess HEP response, attempt lying supine - assess gait tolerance    Alm JAYSON Kingdom PT  04/28/24 11:16 AM

## 2024-04-30 ENCOUNTER — Ambulatory Visit: Admitting: Physical Therapy

## 2024-05-05 ENCOUNTER — Ambulatory Visit: Admitting: Physical Therapy

## 2024-05-07 ENCOUNTER — Ambulatory Visit: Admitting: Physical Therapy

## 2024-05-07 ENCOUNTER — Encounter: Payer: Self-pay | Admitting: Physical Therapy

## 2024-05-07 DIAGNOSIS — M25552 Pain in left hip: Secondary | ICD-10-CM

## 2024-05-07 DIAGNOSIS — R2689 Other abnormalities of gait and mobility: Secondary | ICD-10-CM

## 2024-05-07 DIAGNOSIS — M25551 Pain in right hip: Secondary | ICD-10-CM

## 2024-05-07 DIAGNOSIS — M6281 Muscle weakness (generalized): Secondary | ICD-10-CM

## 2024-05-07 NOTE — Therapy (Signed)
 PHYSICAL THERAPY DISCHARGE SUMMARY  Visits from Start of Care: 12  Current functional level related to goals / functional outcomes: See assessment/goals   Remaining deficits: See assessment/goals   Education / Equipment: HEP and D/C plans  Patient agrees to discharge. Patient goals were met. Patient is being discharged due to meeting goals and conserving visits for THA in October   Patient Name: Justin Walls MRN: [id number] DOB:Jan 15, 1976, 48 y.o., male Today's Date: 05/07/2024  END OF SESSION:  PT End of Session - 05/07/24 1026     Visit Number 12    Number of Visits 20    Date for PT Re-Evaluation 05/06/24    Authorization Type [place] [employer]    Authorization - Number of Visits 27    PT Start Time 1026   pt arrived late   PT Stop Time 1057    PT Time Calculation (min) 31 min    Activity Tolerance Patient tolerated treatment well                 Past Medical History:  Diagnosis Date   Arthritis    Asthma    Hypertension    Pre-diabetes    Past Surgical History:  Procedure Laterality Date   HIP SURGERY     hip sx     TOTAL HIP ARTHROPLASTY Left 03/05/2024   Procedure: ARTHROPLASTY, HIP, TOTAL,POSTERIOR APPROACH;  Surgeon: Edna Toribio LABOR, MD;  Location: [facility];  Service: Orthopedics;  Laterality: Left;   Patient Active Problem List   Diagnosis Date Noted   Osteoarthritis of left hip 03/05/2024   Severe sleep apnea 01/24/2024   Loud snoring 11/06/2022   Bilateral hip joint arthritis 09/21/2022   Primary hypertension 09/21/2022    PCP: Howell Lunger, DO  REFERRING PROVIDER: Edna Toribio LABOR, MD   REFERRING DIAG: M16.0 (ICD-10-CM) - Bilateral primary osteoarthritis of hip   THERAPY DIAG:  Pain in left hip - Plan: PT plan of care cert/re-cert  Pain in right hip - Plan: PT plan of care cert/re-cert  Muscle weakness (generalized) - Plan: PT plan of care cert/re-cert  Other abnormalities of gait and mobility - Plan: PT plan of  care cert/re-cert  Rationale for Evaluation and Treatment: Rehabilitation  ONSET DATE: DOS   SUBJECTIVE:   SUBJECTIVE STATEMENT: Pt reports continued L hip stiffness which is slowly improving.  EVAL: Pt presents to PT today s/p L THA performed by Dr. Edna on 03/05/2024. He arrives late after going to wrong clinic, very fatigued and had to be met in waiting area with PT bringing manual w/c. Had SCFE bilaterally with fixation as teenager, pain and movement has significantly deteriorated over past few years. L LE has been so pain post surgery that he has not been able to independently move it without gait belt or family assist. R LE is also significantly impaired due to previous SCFE, plan is to replace this hip soon as well.   PERTINENT HISTORY: HTN, Previous hip surgeries post SCFE fx  PAIN:  Are you having pain?  Yes: NPRS scale: 5/10 Worst: 10/10 Pain location: L anterior hip, R lateral hip Pain description: sharp, tight, sore Aggravating factors: standing, walking, transfers Relieving factors: medication  PRECAUTIONS: None  RED FLAGS: None   WEIGHT BEARING RESTRICTIONS: WBAT L LE  FALLS:  Has patient fallen in last 6 months? No  LIVING ENVIRONMENT: Lives with: lives with their family Lives in: House/apartment Stairs: No Has following equipment at home: Vannie - 2 wheeled and bed side  commode  OCCUPATION: Catering   PLOF: Independent  PATIENT GOALS: improve both hips - be able to do yard work   OBJECTIVE:  Note: Objective measures were completed at Evaluation unless otherwise noted.  DIAGNOSTIC FINDINGS: See imaging   PATIENT SURVEYS:  LEFS: 2/80  COGNITION: Overall cognitive status: Within functional limits for tasks assessed     SENSATION: WFL  POSTURE: rounded shoulders, forward head, and flexed trunk    LOWER EXTREMITY MMT:  MMT Right eval Left eval  Hip flexion 2+/5   Hip extension    Hip abduction 2+/5   Hip adduction    Hip  internal rotation    Hip external rotation    Knee flexion 3+/5 3/5  Knee extension 3+/5 3/5  Ankle dorsiflexion    Ankle plantarflexion    Ankle inversion    Ankle eversion     (Blank rows = not tested)   LE ROM:  ROM Right  Left 7/17  Hip flexion  65 PROM  Hip extension    Hip abduction    Hip adduction    Hip internal rotation    Hip external rotation    Knee extension    Knee flexion    Ankle dorsiflexion    Ankle plantarflexion    Ankle inversion    Ankle eversion     (Blank rows = not tested, N = WNL, * = concordant pain with testing)  FUNCTIONAL TESTS:  30 Second Sit to Stand: 1 reps - Mod A TUG: 58 seconds with FWW - 03/12/2024  GAIT: Distance walked: 76ft Assistive device utilized: Environmental consultant - 2 wheeled - W/C follow Level of assistance: Mod A Comments: decrease WB L LE - decreased R swing, heavy UE reliance   TREATMENT: [facility] Adult PT Treatment:                                                DATE: 05/07/24  Therex:  LAQ 2x10 5# L only  Therapeutic Activity  collecting information for goals, checking progress, and reviewing with patient Standing hip abd/ext 2x10 L 2.5# Standing march x 20 L - in available range STS from elevated table - no UE support   HOME EXERCISE PROGRAM: Access Code: [email or web address] URL: [email or web address] Date: 05/07/2024 Prepared by: Helene Gasmen  Exercises - Supine Heel Slide  - 4-5 x daily - 7 x weekly - 2 sets - 10 reps - Seated Long Arc Quad  - 1 x daily - 7 x weekly - 2 sets - 10 reps - Supine Straight Leg Raise with Pelvic Floor Contraction  - 1 x daily - 7 x weekly - 3 sets - 10 reps - Standing Hip Abduction with Counter Support  - 1 x daily - 7 x weekly - 2-3 sets - 20 reps - Standing Hip Extension with Counter Support  - 1 x daily - 7 x weekly - 2-3 sets - 20 reps - Standing March with Counter Support  - 1 x daily - 7 x weekly - 3 sets - 10 reps - Sit to Stand  - 1 x daily - 7 x weekly - 3 sets - 10  reps  ASSESSMENT:  CLINICAL IMPRESSION: Pt has progressed well with therapy given baseline hip pain and ROM.  He has met all short and long term goals.  Remaining deficits include strength and ROM.  We  will hold PT for now to conserve visits for his upcoming R THA in October.  He agrees that he can continue with HEP until this time.  EVAL: Patient is a 48 y.o. M who was seen today for physical therapy evaluation and treatment s/p L THA performed on 03/05/2024. Assessment today complicated by pt arriving late and complex medical hx. His L hip was extremely painful today and his R LE is significantly limited due to SCFE in childhood. Has to sit with R LE significantly adduction dur to tightness and pain, also unable to support LLE without gait belt. LEFS score shows severe decrease in subjective functional ability below PLOF post op. Pt would benefit from skilled PT services working on improving functional ability and movement post THA.   OBJECTIVE IMPAIRMENTS: Abnormal gait, decreased activity tolerance, decreased mobility, difficulty walking, decreased ROM, decreased strength, impaired flexibility, improper body mechanics, postural dysfunction, and pain  ACTIVITY LIMITATIONS: carrying, lifting, sitting, standing, squatting, sleeping, stairs, transfers, bed mobility, bathing, dressing, and locomotion level  PARTICIPATION LIMITATIONS: meal prep, cleaning, driving, shopping, community activity, occupation, yard work, and school  PERSONAL FACTORS: Fitness, Time since onset of injury/illness/exacerbation, and 3+ comorbidities: HTN, Previous hip surgeries post SCFE fx are also affecting patient's functional outcome.   REHAB POTENTIAL: Good  CLINICAL DECISION MAKING: Evolving/moderate complexity  EVALUATION COMPLEXITY: Moderate   GOALS: Goals reviewed with patient? No  SHORT TERM GOALS: Target date: 03/31/2024   Pt will be compliant and knowledgeable with initial HEP for improved comfort and  carryover Baseline: initial HEP given  Goal status: MET  2.  Pt will self report left hip pain no greater than 7/10 for improved comfort and functional ability Baseline: 10/10 at worst 04/07/2024: 5/10 Goal status: MET   LONG TERM GOALS: Target date: 05/05/2024   Pt will improve LEFS to no less than 20/80 as proxy for functional improvement with home ADLs and higher level community activity Baseline: 2/80 8/27: 44/80 Goal status: MET   2.  Pt will self report left hip pain no greater than 3/10 for improved comfort and functional ability Baseline: 10/10 at worst 8/27: minimal pain, but stiffness Goal status: MET   3.  Pt will increase 30 Second Sit to Stand rep count to no less than 5 reps for improved balance, strength, and functional mobility Baseline: 1 reps - heavy UE reliance  8/27: 5x w/ UE Goal status: MET   4.  Pt will decrease TUG by MDC value for improved balance and safety with home and community navigation Baseline: will assess next session 03/12/2024: 58 seconds with FWW 8/27: 13 seconds Goal status: MET    5.  Pt will be able to ambulate 223ft Mod I with least restrictive assistance device  Baseline:  8/27: 360+ ft Goal status: MET   PLAN:  PT FREQUENCY: 2x/week  PT DURATION: 12 weeks  PLANNED INTERVENTIONS: 97110-Therapeutic exercises, 97530- Therapeutic activity, 97112- Neuromuscular re-education, 97535- Self Care, and [id number]- Manual therapy  PLAN FOR NEXT SESSION: take vitals, TUG, assess HEP response, attempt lying supine - assess gait tolerance   Helene BRAVO Vernie Piet PT  05/07/24 11:58 AM

## 2024-05-19 ENCOUNTER — Other Ambulatory Visit: Payer: Self-pay | Admitting: Student

## 2024-05-19 DIAGNOSIS — I10 Essential (primary) hypertension: Secondary | ICD-10-CM

## 2024-06-10 ENCOUNTER — Ambulatory Visit: Payer: Self-pay | Admitting: Emergency Medicine

## 2024-06-17 ENCOUNTER — Encounter (HOSPITAL_COMMUNITY): Admission: RE | Admit: 2024-06-17 | Source: Ambulatory Visit

## 2024-06-25 ENCOUNTER — Ambulatory Visit (HOSPITAL_COMMUNITY): Admit: 2024-06-25 | Admitting: Orthopedic Surgery

## 2024-06-25 SURGERY — ARTHROPLASTY, HIP, TOTAL,POSTERIOR APPROACH
Anesthesia: Spinal | Site: Hip | Laterality: Right
